# Patient Record
Sex: Female | Born: 1950
Health system: Southern US, Community
[De-identification: ages and names within clinical notes are randomized; demographics above are authoritative.]

## PROBLEM LIST (undated history)

## (undated) DIAGNOSIS — I10 Essential (primary) hypertension: Secondary | ICD-10-CM

## (undated) DIAGNOSIS — E7800 Pure hypercholesterolemia, unspecified: Secondary | ICD-10-CM

## (undated) DIAGNOSIS — Z8489 Family history of other specified conditions: Secondary | ICD-10-CM

## (undated) DIAGNOSIS — M069 Rheumatoid arthritis, unspecified: Secondary | ICD-10-CM

## (undated) HISTORY — PX: TOTAL ABDOMINAL HYSTERECTOMY W/ BILATERAL SALPINGOOPHORECTOMY: SHX83

## (undated) HISTORY — PX: EYE SURGERY: SHX253

## (undated) HISTORY — DX: Rheumatoid arthritis, unspecified: M06.9

## (undated) HISTORY — PX: HIP SURGERY: SHX245

## (undated) HISTORY — DX: Essential (primary) hypertension: I10

## (undated) HISTORY — DX: Pure hypercholesterolemia, unspecified: E78.00

---

## 1998-12-08 ENCOUNTER — Other Ambulatory Visit: Admission: RE | Admit: 1998-12-08 | Discharge: 1998-12-08 | Payer: Self-pay | Admitting: Obstetrics & Gynecology

## 1999-12-23 ENCOUNTER — Other Ambulatory Visit: Admission: RE | Admit: 1999-12-23 | Discharge: 1999-12-23 | Payer: Self-pay | Admitting: Obstetrics & Gynecology

## 2000-04-01 ENCOUNTER — Encounter (INDEPENDENT_AMBULATORY_CARE_PROVIDER_SITE_OTHER): Payer: Self-pay

## 2000-04-01 ENCOUNTER — Observation Stay (HOSPITAL_COMMUNITY): Admission: RE | Admit: 2000-04-01 | Discharge: 2000-04-02 | Payer: Self-pay | Admitting: Obstetrics & Gynecology

## 2001-01-31 ENCOUNTER — Encounter: Payer: Self-pay | Admitting: General Practice

## 2001-01-31 ENCOUNTER — Encounter: Admission: RE | Admit: 2001-01-31 | Discharge: 2001-01-31 | Payer: Self-pay | Admitting: General Practice

## 2001-02-03 ENCOUNTER — Encounter: Payer: Self-pay | Admitting: General Practice

## 2001-02-03 ENCOUNTER — Encounter: Admission: RE | Admit: 2001-02-03 | Discharge: 2001-02-03 | Payer: Self-pay | Admitting: General Practice

## 2001-11-27 DIAGNOSIS — D229 Melanocytic nevi, unspecified: Secondary | ICD-10-CM

## 2001-11-27 HISTORY — DX: Melanocytic nevi, unspecified: D22.9

## 2003-04-25 ENCOUNTER — Encounter: Payer: Self-pay | Admitting: Ophthalmology

## 2003-04-26 ENCOUNTER — Ambulatory Visit (HOSPITAL_COMMUNITY): Admission: RE | Admit: 2003-04-26 | Discharge: 2003-04-26 | Payer: Self-pay | Admitting: Ophthalmology

## 2003-11-22 ENCOUNTER — Ambulatory Visit (HOSPITAL_COMMUNITY): Admission: RE | Admit: 2003-11-22 | Discharge: 2003-11-22 | Payer: Self-pay | Admitting: Gastroenterology

## 2003-11-22 ENCOUNTER — Encounter (INDEPENDENT_AMBULATORY_CARE_PROVIDER_SITE_OTHER): Payer: Self-pay | Admitting: *Deleted

## 2004-09-23 DIAGNOSIS — D229 Melanocytic nevi, unspecified: Secondary | ICD-10-CM

## 2004-09-23 HISTORY — DX: Melanocytic nevi, unspecified: D22.9

## 2007-03-29 ENCOUNTER — Encounter: Payer: Self-pay | Admitting: Internal Medicine

## 2008-04-01 ENCOUNTER — Encounter: Payer: Self-pay | Admitting: Internal Medicine

## 2009-01-03 ENCOUNTER — Encounter: Payer: Self-pay | Admitting: Internal Medicine

## 2009-02-26 ENCOUNTER — Encounter: Payer: Self-pay | Admitting: Internal Medicine

## 2009-03-03 ENCOUNTER — Encounter: Payer: Self-pay | Admitting: Internal Medicine

## 2009-04-24 ENCOUNTER — Encounter: Payer: Self-pay | Admitting: Internal Medicine

## 2009-05-08 ENCOUNTER — Ambulatory Visit: Payer: Self-pay | Admitting: Internal Medicine

## 2009-05-08 DIAGNOSIS — I6529 Occlusion and stenosis of unspecified carotid artery: Secondary | ICD-10-CM | POA: Insufficient documentation

## 2009-05-09 ENCOUNTER — Encounter: Payer: Self-pay | Admitting: Internal Medicine

## 2009-05-22 ENCOUNTER — Ambulatory Visit: Payer: Self-pay

## 2009-05-22 ENCOUNTER — Encounter: Payer: Self-pay | Admitting: Internal Medicine

## 2009-05-29 ENCOUNTER — Telehealth: Payer: Self-pay | Admitting: Internal Medicine

## 2009-06-13 ENCOUNTER — Encounter: Payer: Self-pay | Admitting: Internal Medicine

## 2009-06-16 ENCOUNTER — Telehealth (INDEPENDENT_AMBULATORY_CARE_PROVIDER_SITE_OTHER): Payer: Self-pay | Admitting: *Deleted

## 2009-07-24 ENCOUNTER — Emergency Department (HOSPITAL_COMMUNITY): Admission: EM | Admit: 2009-07-24 | Discharge: 2009-07-24 | Payer: Self-pay | Admitting: Family Medicine

## 2009-08-19 ENCOUNTER — Ambulatory Visit: Payer: Self-pay | Admitting: Internal Medicine

## 2009-11-13 ENCOUNTER — Telehealth: Payer: Self-pay | Admitting: Internal Medicine

## 2009-11-21 ENCOUNTER — Telehealth (INDEPENDENT_AMBULATORY_CARE_PROVIDER_SITE_OTHER): Payer: Self-pay | Admitting: *Deleted

## 2010-05-26 ENCOUNTER — Telehealth: Payer: Self-pay | Admitting: Internal Medicine

## 2010-06-09 ENCOUNTER — Encounter: Payer: Self-pay | Admitting: Internal Medicine

## 2010-06-10 ENCOUNTER — Ambulatory Visit: Payer: Self-pay | Admitting: Internal Medicine

## 2010-06-10 DIAGNOSIS — E7800 Pure hypercholesterolemia, unspecified: Secondary | ICD-10-CM | POA: Insufficient documentation

## 2010-06-16 ENCOUNTER — Telehealth: Payer: Self-pay | Admitting: Internal Medicine

## 2010-06-16 LAB — CONVERTED CEMR LAB
AST: 25 units/L (ref 0–37)
Cholesterol: 129 mg/dL (ref 0–200)
HDL: 57.2 mg/dL (ref >39.00)
LDL Cholesterol: 57 mg/dL (ref 0–99)
Total CHOL/HDL Ratio: 2
Triglycerides: 76 mg/dL (ref 0.0–149.0)
VLDL: 15.2 mg/dL (ref 0.0–40.0)

## 2010-06-22 ENCOUNTER — Telehealth (INDEPENDENT_AMBULATORY_CARE_PROVIDER_SITE_OTHER): Payer: Self-pay | Admitting: *Deleted

## 2010-10-13 NOTE — Progress Notes (Signed)
Summary: question re lab work  Phone Note Call from Patient   Caller: Patient Reason for Call: Talk to Nurse Summary of Call: pt calling to find out id she needs lab work before her doppler 9-28-pls call [phone] Initial call taken by: Glynda Jaeger,  May 26, 2010 10:00 AM  Follow-up for Phone Call        I called and spoke with the pt. I made her aware that we will review with Dr. Tenny Craw and call her back. She is agreeable. Sherri Rad, RN, BSN  May 26, 2010 10:11 AM   Additional Follow-up for Phone Call Additional follow up Details #1::        Yes  Needs fasting lipids and AST. Additional Follow-up by: Sherrill Raring, MD, [facility],  May 26, 2010 11:58 AM     Appended Document: question re lab work Called patient .Marland Kitchen...she will have fasting lab work on 06/10/2010 after doppler study.

## 2010-10-13 NOTE — Progress Notes (Signed)
Summary: med question  Phone Note Call from Patient Call back at Work Phone [phone]   Caller: Patient Reason for Call: Talk to Nurse Summary of Call: has some questions about refill on zocor... bottle states refill good for 1 yr pharmacy states only 6 mths Initial call taken by: Migdalia Dk,  November 13, 2009 10:38 AM  Follow-up for Phone Call        Called patient back ...she will do some price comparison and will call me if she wants script sent to another pharmacy or mailed to her if she wants to mail to [place]. Follow-up by: Suzan Garibaldi RN

## 2010-10-13 NOTE — Progress Notes (Signed)
Summary: rtn called from yesterday  Phone Note Call from Patient Call back at Home Phone [phone]   Caller: Spouse work # [phone] Reason for Call: Talk to Nurse Details for Reason: rtn jackie called from yesterday.  Initial call taken by: Lorne Skeens,  June 16, 2010 8:31 AM  Follow-up for Phone Call        Pt. returned call. Lipid and Carotid u/s results given. Pt. verbalized understanding. Follow-up by: Ollen Gross, RN, BSN,  June 16, 2010 9:01 AM

## 2010-10-13 NOTE — Progress Notes (Signed)
  Message left on phone to Mail out Doppler,Labs.Marland KitchenMarland KitchenMarland KitchenBoth dropped in mail this a.m  [facility]  June 22, 2010 9:36 AM      Appended Document:  Mailed ptT labs out to her today

## 2010-10-13 NOTE — Progress Notes (Signed)
  Spoke w/ pt this am mailed out Labs to her that were drawn in December. Maria Rich  November 21, 2009 11:43 AM

## 2010-10-13 NOTE — Miscellaneous (Signed)
Summary: Orders Update  Clinical Lists Changes  Orders: Added new Test order of Carotid Duplex (Carotid Duplex) - Signed 

## 2010-12-23 ENCOUNTER — Inpatient Hospital Stay (INDEPENDENT_AMBULATORY_CARE_PROVIDER_SITE_OTHER)
Admission: RE | Admit: 2010-12-23 | Discharge: 2010-12-23 | Disposition: A | Discharge status: Home / Self Care | Payer: BLUE CROSS/BLUE SHIELD | Source: Ambulatory Visit | Attending: Emergency Medicine | Admitting: Emergency Medicine

## 2010-12-23 DIAGNOSIS — B029 Zoster without complications: Secondary | ICD-10-CM

## 2011-01-21 ENCOUNTER — Telehealth: Payer: Self-pay | Admitting: Internal Medicine

## 2011-01-25 MED ORDER — SIMVASTATIN 20 MG PO TABS: 20.0000 mg | ORAL_TABLET | Freq: Every day | ORAL | Status: DC

## 2011-01-25 NOTE — Telephone Encounter (Signed)
Pt needs a 90 day supply for Zocor (simvastin) 20 mg sent to [facility] in [place]. Qty 90 with 3 refills

## 2011-01-29 NOTE — Op Note (Signed)
[facility]  Patient:    Maria Rich, Maria Rich                     MRN: [id number] Proc. Date: 04/01/00 Adm. Date:  [id number] Attending:  Lars Pinks                           Operative Report  PREOPERATIVE DIAGNOSIS:  Myoma, uterine.  POSTOPERATIVE DIAGNOSIS:  Myoma, uterine.  PROCEDURE:  Total abdominal hysterectomy and bilateral salpingo-oophorectomy.  SURGEON:  Richard D. Arlyce Dice, M.D.  ASSISTANT:  Luvenia Redden, M.D.  ANESTHESIA:  Epidural.  ESTIMATED BLOOD LOSS:  100 cc.  FINDINGS:  There was an enlarged uterus, approximately 400 gm, with multiple myoma.  The kidneys were palpable bilaterally.  The liver edge was smooth. The pelvis was free of adhesions, endometriosis, or pathology.  INDICATIONS:  This is a 60 year old Gravida 1, Para 1, who has been noted to have enlarging myoma over the last five years, and in the past one year, she has developed heavier periods and because of the enlarging myoma and the abnormal vaginal bleeding, the decision was made to proceed with hysterectomy. Due to the patients age, the decision was made to do a prophylactic oophorectomy as well.  DESCRIPTION OF PROCEDURE:  The patient was taken to the operating room.  An epidural anesthesia was placed.  The abdomen was prepped and draped in a sterile fashion.  The bladder was catheterized.  A low transverse incision was made and carried down to the fascia which was extended transversely.  The rectus muscle was divided from the overlying rectus sheath and then divided in the midline.  The peritoneum was entered sharply and extended vertically.  The self-retaining OConnor-OSullivan retractor was placed.  The bowel was packed.  The uterus was delivered through the incision, and round ligaments were cauterized and incised.  The bladder plane was created over the anterior surface of the uterus.  The infundibular pelvic ligaments were isolated bilaterally,  clamped, and doubly ligated.  The uterine ora was skeletized, clamped, and ligated.  The perimetria was clamped, cut, and ligated.  The vagina was entered sharply, and the cervix, uterus, tubes, and ovaries were then removed.  The angles of the vagina were controlled with figure-of-eight angled sutures.  The vaginal cuff was then closed with figure-of-eight sutures.  The pelvis was inspected, and hemostasis was noted to be present. The lap packs were removed.  The self-retaining retractor was removed.  The peritoneum was closed with running #0 Vicryl suture which included some of the rectus muscle to reappose it in the midline.  The fascia was then closed with a running Vicryl suture, and the skin was closed with a subcuticular 4-0 Dexon suture.  The patient tolerated the procedure well and left the operating room in good condition. DD:  04/01/00 TD:  04/03/00 Job: [id number]

## 2011-01-29 NOTE — Op Note (Signed)
NAME:  Maria Rich, Maria Rich                        ACCOUNT NO.:  [id number]   MEDICAL RECORD NO.:  [id number]                   PATIENT TYPE:  OIB   LOCATION:  NA                                   FACILITY:  [facility]   PHYSICIAN:  Guadelupe Sabin, M.D.             DATE OF BIRTH:  May 05, 1951   DATE OF PROCEDURE:  04/26/2003  DATE OF DISCHARGE:                                 OPERATIVE REPORT   PREOPERATIVE DIAGNOSIS:  Posterior subcapsular cataract, right eye.   POSTOPERATIVE DIAGNOSIS:  Posterior subcapsular cataract, right eye.   NAME OF OPERATION:  Planned extracapsular cataract extraction-  phacoemulsification, primary insertion of posterior chamber intraocular lens  implant.   SURGEON:  Dr. Cecilie Kicks.   ASSISTANT:  Nurse.   ANESTHESIA:  Local 4% Xylocaine, 0.75% Marcaine, retrobulbar block, topical  tetracaine, intraocular Xylocaine. Anesthesia standby required. Patient  given sodium Pentothal intravenously during the period of retrobulbar  blocking.   OPERATIVE PROCEDURE:  After the patient was prepped and draped, a lid  speculum was inserted in the left eye. GS tonometry was recorded at 5 to 6  scale units with a 5.5 g weight. A peritomy was performed adjacent to the  limbus from the 11 to 1 o'clock position. The corneoscleral junction was  cleaned and a corneoscleral groove made with a 45-degree super blade. The  anterior chamber was then entered with a 2.5-mm diamond keratome at the 12  o'clock position and the 15-degree blade at the 2:30 position. Using a bent  26-gauge needle and a Healon syringe, a circular capsulorrhexis was begun  and completed with the Graybow forceps. Hydrodissection and hydrodelineation  were performed 1% Xylocaine. The 30-degree phacoemulsification tip was then  inserted with slow-controlled emulsification but basically primary  irrigation/aspiration of the rather soft posterior subcapsular cataract.  Total ultrasonic time 30 seconds. Average power  level 9%. Total amount of  fluid used 60 cc. Following removal of the nucleus, the residual cortex was  aspirated with the irrigation/aspiration tip. The posterior capsule appeared  intact with a brilliant red fundus reflex. It was therefore elected to  insert an [employer]er[employer] medical optics [id number] silicone three piece posterior  chamber intraocular lens implant, diopter strength +19.50. This was inserted  with the McDonald forceps into the anterior chamber and then centered into  the capsular bag using the [facility] lens rotator. The lens appeared to be well  centered. The Healon which had been used throughout the procedure was  aspirated and replaced with a balanced salt solution and Miochol ophthalmic  solution. The operative incisions appeared to be leaking slightly, and two  10-0 interrupted nylon sutures were used to close the incision in this very  active patient. Pilopine ophthalmic solution ointment was instilled in  conjunctival cul-de-sac and Maxitrol ointment. The light patch and  protective shield were applied to the operated right eye. Duration of  procedure 45 minutes. The patient left the operating room  for the recovery  room in good condition.                                               Guadelupe Sabin, M.D.    HNJ/MEDQ  D:  04/26/2003  T:  04/27/2003  Job:  [id number]

## 2011-01-29 NOTE — Discharge Summary (Signed)
[facility]  Patient:    Maria Rich, Maria Rich                     MRN: [id number] Adm. Date:  [id number] Disc. Date: [id number] Attending:  Lars Pinks                           Discharge Summary  FINAL DIAGNOSIS:              Myoma uteri.  SECONDARY DIAGNOSIS:          None.  PROCEDURE:                    Total abdominal hysterectomy and bilateral salpingo-oophorectomy.  COMPLICATIONS:                None.  CONDITION ON DISCHARGE:       Improved.  HOSPITAL COURSE:               This is a 60 year old gravida 1, para 1 who was admitted for total abdominal hysterectomy and bilateral salpingo-oophorectomy due enlarging uterine myoma.  The patient was taken to the operating room on the day of admission, where a total abdominal hysterectomy and bilateral salpingo-oophorectomy was performed under epidural anesthesia otherwise complication.  The patients postoperative course was benign.  On the morning of the first postoperative day, the patient was ambulating well, urinating, passing gas and eating.  She also requested discharge.  She was afebrile. Vital signs were stable.  Her blood work was normal.  She was, therefore, discharged 24 hours postoperatively.  She was discharged on a regular diet and told to limit her activity.  She was given Tylox, #30, to take 1-2 q.4h. for pain and asked to return to the office in four weeks for follow up evaluation.  LABORATORY DATA:              Hemoglobin on admission was 14.1 with 7800 white count and platelet count 268.  Postoperatively, hemoglobin was 12.5 with white count 7.9.  Routine preoperative chemistry studies were all within normal limits including liver functions.  Urinalysis was negative.  The pathology report showed that the uterus had myoma on it, which were all benign.  The uterus weighed 515 g with multiple benign uterine myoma.  The endometrium was secretory and benign.  There was a rare  focus of adenomyosis. The right tube and both ovaries were normal without pathological diagnosis. DD:  04/20/00 TD:  04/21/00 Job: [id number]

## 2011-01-29 NOTE — H&P (Signed)
   NAME:  Maria Rich, Maria Rich                        ACCOUNT NO.:  [id number]   MEDICAL RECORD NO.:  [id number]                   PATIENT TYPE:  OIB   LOCATION:  NA                                   FACILITY:  [facility]   PHYSICIAN:  Guadelupe Sabin, M.D.             DATE OF BIRTH:  03-22-1951   DATE OF ADMISSION:  DATE OF DISCHARGE:                                HISTORY & PHYSICAL   REASON FOR ADMISSION:  This was a planned outpatient readmission of this 60-  year-old  admitted for cataract implant surgery of the right eye.   HISTORY OF PRESENT ILLNESS:  This patient has noted increased difficulty  seeing with the right eye. The patient closes her left eye and finds that  her right vision is hazy. The patient was first seen in my office on March 13, 2003 complaining of a veil-like haze in the right eye. Examination  revealed a visual acuity of 20/30 -2 right eye, 20/25 +2 left eye. Slit-lamp  examination revealed a posterior subcapsular cataract which was felt to be  the cause of her hazy vision. Detailed fundus examination and slit-lamp and  applanation tonometry were all within normal limits. It was felt that this  was a significant problem, and the patient wished to proceed with cataract  implant surgery. She was given oral discussion and printed information  concerned the procedure and its possible complications. She signed an  informed consent, and arrangements were made for outpatient admission at  this time.   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 She smokes  one pack of cigarettes per day, using a hormone patch, but otherwise is in  excellent general health.   REVIEW OF SYSTEMS:  No cardiorespiratory complaints.   PHYSICAL EXAMINATION:  GENERAL APPEARANCE:  The patient is a pleasant 60-  year-old white female in no acute distress.  HEENT:  Ocular exam as noted above.  CHEST:   Lungs clear to percussion and auscultation.  HEART:  Normal sinus rhythm. No cardiomegaly. No murmurs.  ABDOMEN:  Negative.  EXTREMITIES:  Negative.   ADMISSION DIAGNOSES:  Posterior subcapsular cataract, right eye.   SURGICAL PLAN:  Cataract implant surgery, right eye.                                                Guadelupe Sabin, M.D.    HNJ/MEDQ  D:  04/26/2003  T:  04/27/2003  Job:  [id number]

## 2011-03-30 ENCOUNTER — Other Ambulatory Visit: Payer: Self-pay | Admitting: Physician Assistant

## 2011-05-10 ENCOUNTER — Telehealth: Payer: Self-pay | Admitting: *Deleted

## 2011-05-10 DIAGNOSIS — E782 Mixed hyperlipidemia: Secondary | ICD-10-CM

## 2011-05-10 MED ORDER — SIMVASTATIN 20 MG PO TABS: 20.0000 mg | ORAL_TABLET | Freq: Every day | ORAL | Status: DC

## 2011-05-10 NOTE — Telephone Encounter (Signed)
Patient called requesting to schedule a yearly carotid ultrasound and labs. She would also like to have her lab work completed on the same day. Scheduled both for 10/12. Needs refill on Simvastatin 20mg  prior to visit. Asking for 90 day supply.

## 2011-06-25 ENCOUNTER — Other Ambulatory Visit: Payer: BLUE CROSS/BLUE SHIELD | Admitting: *Deleted

## 2011-06-25 ENCOUNTER — Encounter: Payer: BLUE CROSS/BLUE SHIELD | Admitting: *Deleted

## 2011-06-28 ENCOUNTER — Other Ambulatory Visit: Payer: Self-pay | Admitting: Internal Medicine

## 2011-06-28 DIAGNOSIS — I6529 Occlusion and stenosis of unspecified carotid artery: Secondary | ICD-10-CM

## 2011-06-30 ENCOUNTER — Other Ambulatory Visit: Payer: BLUE CROSS/BLUE SHIELD | Admitting: *Deleted

## 2011-07-02 ENCOUNTER — Encounter (INDEPENDENT_AMBULATORY_CARE_PROVIDER_SITE_OTHER): Payer: BC Managed Care – PPO | Admitting: *Deleted

## 2011-07-02 ENCOUNTER — Other Ambulatory Visit (INDEPENDENT_AMBULATORY_CARE_PROVIDER_SITE_OTHER): Payer: BC Managed Care – PPO | Admitting: *Deleted

## 2011-07-02 DIAGNOSIS — E782 Mixed hyperlipidemia: Secondary | ICD-10-CM

## 2011-07-02 DIAGNOSIS — I6529 Occlusion and stenosis of unspecified carotid artery: Secondary | ICD-10-CM

## 2011-07-02 LAB — LIPID PANEL
Total CHOL/HDL Ratio: 2
Triglycerides: 58 mg/dL (ref 0.0–149.0)

## 2011-07-05 ENCOUNTER — Encounter: Payer: Self-pay | Admitting: Internal Medicine

## 2011-07-09 ENCOUNTER — Ambulatory Visit (INDEPENDENT_AMBULATORY_CARE_PROVIDER_SITE_OTHER): Payer: BC Managed Care – PPO | Admitting: Internal Medicine

## 2011-07-09 ENCOUNTER — Encounter: Payer: Self-pay | Admitting: Internal Medicine

## 2011-07-09 DIAGNOSIS — I10 Essential (primary) hypertension: Secondary | ICD-10-CM

## 2011-07-09 DIAGNOSIS — I6529 Occlusion and stenosis of unspecified carotid artery: Secondary | ICD-10-CM

## 2011-07-09 DIAGNOSIS — Z720 Tobacco use: Secondary | ICD-10-CM

## 2011-07-09 DIAGNOSIS — F17200 Nicotine dependence, unspecified, uncomplicated: Secondary | ICD-10-CM

## 2011-07-09 DIAGNOSIS — E7800 Pure hypercholesterolemia, unspecified: Secondary | ICD-10-CM

## 2011-07-09 DIAGNOSIS — I119 Hypertensive heart disease without heart failure: Secondary | ICD-10-CM

## 2011-07-09 MED ORDER — LISINOPRIL 5 MG PO TABS: 5.0000 mg | ORAL_TABLET | Freq: Every day | ORAL | Status: DC

## 2011-07-09 NOTE — Patient Instructions (Signed)
Return for BMET Lab work in about 10 days  Return to see Dr.Ross in 6 to 8 weeks

## 2011-07-09 NOTE — Progress Notes (Signed)
HPIMs. Maria Rich is a 60 year old with a history of mild CV disease (by screening at an outpatient center). She has no known coronary artery disease. Since seen very active.  [employer] DO.  Swims.  NoCP  No SOB.  Still smoking  1/2 ppd. BP recently going up.  BP 160 at home.     No Known Allergies  Current Outpatient Prescriptions  Medication Sig Dispense Refill  . aspirin 81 MG tablet Take 81 mg by mouth daily.        . calcium carbonate 200 MG capsule 600 mg DAILY      . estradiol (CLIMARA - DOSED IN MG/24 HR) 0.05 mg/24hr Place 1 patch onto the skin once a week.        . Fish Oil-Cholecalciferol (FISH OIL + D3) 1200-1000 MG-UNIT CAPS Take by mouth. Take 1 tab twice a day       . Multiple Vitamin (MULTIVITAMIN) tablet Take 1 tablet by mouth daily.        . Niacin 500 MG TBCR Take by mouth.        . Nutritional Supplements (MELATONIN PO) Take by mouth. Daily during the week to help pt sleep       . simvastatin (ZOCOR) 20 MG tablet Take 1 tablet (20 mg total) by mouth at bedtime.  90 tablet  0    No past medical history on file.  Past Surgical History  Procedure Date  . Eye surgery     cataract implant surgery,right eye  . Total abdominal hysterectomy w/ bilateral salpingoophorectomy     No family history on file.  History   Social History  . Marital Status: Married    Spouse Name: N/A    Number of Children: N/A  . Years of Education: N/A   Occupational History  . Not on file.   Social History Main Topics  . Smoking status: Current Everyday Smoker  . Smokeless tobacco: Not on file  . Alcohol Use: Not on file  . Drug Use: Not on file  . Sexually Active: Not on file   Other Topics Concern  . 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 She smokes one pack of cigarettes per day, down to 4 cigs per day, using a hormone patch    Review of Systems:  All systems reviewed.  They are negative to the above problem  except as previously stated.  Vital Signs: BP 160/70  Pulse 50  Ht 5\' 2"  (1.575 m)  Wt 121 lb (54.885 kg)  BMI 22.13 kg/m2  Physical Exam  Patient is in NAD HEENT:  Normocephalic, atraumatic. EOMI, PERRLA.  Neck: JVP is normal. No thyromegaly. No bruits.  Lungs: clear to auscultation. No rales no wheezes.  Heart: Regular rate and rhythm. Normal S1, S2. No S3.   No significant murmurs. PMI not displaced.  Abdomen:  Supple, nontender. Normal bowel sounds. No masses. No hepatomegaly.  Extremities:   Good distal pulses throughout. No lower extremity edema.  Musculoskeletal :moving all extremities.  Neuro:   alert and oriented x3.  CN II-XII grossly intact.   Assessment and Plan:

## 2011-07-11 DIAGNOSIS — Z720 Tobacco use: Secondary | ICD-10-CM | POA: Insufficient documentation

## 2011-07-11 DIAGNOSIS — I10 Essential (primary) hypertension: Secondary | ICD-10-CM | POA: Insufficient documentation

## 2011-07-11 NOTE — Assessment & Plan Note (Signed)
WIll f/u in future.

## 2011-07-11 NOTE — Assessment & Plan Note (Signed)
Counselled on quitting. 

## 2011-07-11 NOTE — Assessment & Plan Note (Signed)
Excellent control  Could back down to 10 of simvistatin.  F/U in future.

## 2011-07-11 NOTE — Assessment & Plan Note (Signed)
BP is up.  I would add lisinoprl 5.  F/U BMET in 10 days.  F/U in clinic in 6 to 8 wks.

## 2011-07-20 ENCOUNTER — Other Ambulatory Visit (INDEPENDENT_AMBULATORY_CARE_PROVIDER_SITE_OTHER): Payer: BC Managed Care – PPO | Admitting: *Deleted

## 2011-07-20 DIAGNOSIS — I119 Hypertensive heart disease without heart failure: Secondary | ICD-10-CM

## 2011-07-20 LAB — BASIC METABOLIC PANEL
CO2: 26 mEq/L (ref 19–32)
Chloride: 109 mEq/L (ref 96–112)
Sodium: 141 mEq/L (ref 135–145)

## 2011-08-09 ENCOUNTER — Telehealth: Payer: Self-pay | Admitting: Internal Medicine

## 2011-08-09 MED ORDER — LISINOPRIL 5 MG PO TABS: ORAL_TABLET | ORAL | Status: DC

## 2011-08-09 NOTE — Telephone Encounter (Signed)
Increase to 10mg   Check BMET in 10 days.

## 2011-08-09 NOTE — Telephone Encounter (Addendum)
Called patient back. She is concerned about her BP readings. Started Lisinopril 5mg  every day after the last office visit. She states that she has 11 readings of BP 140 to 149 over 60 to 70 7 readings of BP 150 to 159 over 60 to 70 and 5 readings where BP is greater than 160/70. These readings were taken with a wrist cuff. Advised will discuss with Dr.Ross and call her back.

## 2011-08-09 NOTE — Telephone Encounter (Signed)
Called patient back. She will increase Lisinopril to 10mg  every day and have a bmet on 12/6 at the [facility]. Office.

## 2011-08-09 NOTE — Telephone Encounter (Signed)
New Msg: Pt call with questions on pt BP readings. Please return pt call to discuss further.

## 2011-08-19 ENCOUNTER — Other Ambulatory Visit (INDEPENDENT_AMBULATORY_CARE_PROVIDER_SITE_OTHER): Payer: BC Managed Care – PPO

## 2011-08-19 DIAGNOSIS — I119 Hypertensive heart disease without heart failure: Secondary | ICD-10-CM

## 2011-08-19 LAB — BASIC METABOLIC PANEL
BUN: 15 mg/dL (ref 6–23)
Chloride: 108 mEq/L (ref 96–112)
Glucose, Bld: 97 mg/dL (ref 70–99)
Potassium: 4 mEq/L (ref 3.5–5.1)

## 2011-08-25 DIAGNOSIS — H2640 Unspecified secondary cataract: Secondary | ICD-10-CM | POA: Insufficient documentation

## 2011-09-09 ENCOUNTER — Encounter: Payer: Self-pay | Admitting: Internal Medicine

## 2011-09-09 ENCOUNTER — Ambulatory Visit (INDEPENDENT_AMBULATORY_CARE_PROVIDER_SITE_OTHER): Payer: BC Managed Care – PPO | Admitting: Internal Medicine

## 2011-09-09 DIAGNOSIS — F17200 Nicotine dependence, unspecified, uncomplicated: Secondary | ICD-10-CM

## 2011-09-09 DIAGNOSIS — I6529 Occlusion and stenosis of unspecified carotid artery: Secondary | ICD-10-CM

## 2011-09-09 DIAGNOSIS — Z720 Tobacco use: Secondary | ICD-10-CM

## 2011-09-09 DIAGNOSIS — I10 Essential (primary) hypertension: Secondary | ICD-10-CM

## 2011-09-09 DIAGNOSIS — I119 Hypertensive heart disease without heart failure: Secondary | ICD-10-CM

## 2011-09-09 DIAGNOSIS — E7800 Pure hypercholesterolemia, unspecified: Secondary | ICD-10-CM

## 2011-09-09 MED ORDER — LISINOPRIL-HYDROCHLOROTHIAZIDE 10-12.5 MG PO TABS: 1.0000 | ORAL_TABLET | Freq: Every day | ORAL | Status: DC

## 2011-09-09 MED ORDER — LISINOPRIL 5 MG PO TABS: ORAL_TABLET | ORAL | Status: DC

## 2011-09-09 NOTE — Assessment & Plan Note (Signed)
Mild disease.

## 2011-09-09 NOTE — Patient Instructions (Signed)
Lab work in 2 weeks at [facility] [address]

## 2011-09-09 NOTE — Assessment & Plan Note (Addendum)
I would switch to Prinizide 10/12.5.  She will call in a few weeks.  Still not controlled.  Repeat labs in 2 wks.

## 2011-09-09 NOTE — Progress Notes (Addendum)
HPI Patient is a 60 year old with a history of mild CV disease, dyslipidemia and HTN.  I saw her in October.  BP was up at that time . I added 5 lisinopril  Patient has increased to 10 at home as bp was still up.   At home BP is labile 120s to 170s  Stress level has gone down just recently No Known Allergies  Current Outpatient Prescriptions  Medication Sig Dispense Refill  . aspirin 81 MG tablet Take 81 mg by mouth daily.        . calcium carbonate 200 MG capsule 600 mg DAILY      . estradiol (CLIMARA - DOSED IN MG/24 HR) 0.05 mg/24hr Place 1 patch onto the skin once a week.        . Fish Oil-Cholecalciferol (FISH OIL + D3) 1200-1000 MG-UNIT CAPS Take by mouth. Take 1 tab twice a day       . lisinopril (PRINIVIL,ZESTRIL) 5 MG tablet 2 tabs every day  30 tablet  11  . Multiple Vitamin (MULTIVITAMIN) tablet Take 1 tablet by mouth daily.        . Niacin 500 MG TBCR Take by mouth.        . Nutritional Supplements (MELATONIN PO) Take by mouth. Daily during the week to help pt sleep       . simvastatin (ZOCOR) 20 MG tablet Take 10 mg by mouth at bedtime.          No past medical history on file.  Past Surgical History  Procedure Date  . Eye surgery     cataract implant surgery,right eye  . Total abdominal hysterectomy w/ bilateral salpingoophorectomy     No family history on file.  History   Social History  . Marital Status: Married    Spouse Name: N/A    Number of Children: N/A  . Years of Education: N/A   Occupational History  . Not on file.   Social History Main Topics  . Smoking status: Current Everyday Smoker  . Smokeless tobacco: Not on file  . Alcohol Use: Not on file  . Drug Use: Not on file  . Sexually Active: Not on file   Other Topics Concern  . 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 She smokes one pack of cigarettes per day, down to 4 cigs per day, using a hormone patch    Review of  Systems:  All systems reviewed.  They are negative to the above problem except as previously stated.  Vital Signs: BP 152/72  Pulse 52  Ht 5' 1.5" (1.562 m)  Wt 121 lb 6.4 oz (55.067 kg)  BMI 22.57 kg/m2  Physical Exam  Patient is in NAD  HEENT:  Normocephalic, atraumatic. EOMI, PERRLA.  Neck: JVP is normal. No thyromegaly. No bruits.  Lungs: clear to auscultation. No rales no wheezes.  Heart: Regular rate and rhythm. Normal S1, S2. No S3.   No significant murmurs. PMI not displaced.  Abdomen:  Supple, nontender. Normal bowel sounds. No masses. No hepatomegaly.  Extremities:   Good distal pulses throughout. No lower extremity edema.  Musculoskeletal :moving all extremities.  Neuro:   alert and oriented x3.  CN II-XII grossly intact. EKG;  Sinus bradycardia.  52 bpm.  Septal MI.   Assessment and Plan:

## 2011-09-09 NOTE — Assessment & Plan Note (Signed)
Keep on same regimen. 

## 2011-09-09 NOTE — Assessment & Plan Note (Signed)
Counelled  Has smokeless tobacco to use.  She did not have luck with Chantix in past.

## 2011-09-23 ENCOUNTER — Other Ambulatory Visit (INDEPENDENT_AMBULATORY_CARE_PROVIDER_SITE_OTHER): Payer: BC Managed Care – PPO

## 2011-09-23 DIAGNOSIS — I119 Hypertensive heart disease without heart failure: Secondary | ICD-10-CM

## 2011-09-23 DIAGNOSIS — I10 Essential (primary) hypertension: Secondary | ICD-10-CM

## 2011-09-23 LAB — BASIC METABOLIC PANEL
CO2: 27 mEq/L (ref 19–32)
Calcium: 9.6 mg/dL (ref 8.4–10.5)
Glucose, Bld: 96 mg/dL (ref 70–99)
Sodium: 139 mEq/L (ref 135–145)

## 2011-10-07 ENCOUNTER — Telehealth: Payer: Self-pay | Admitting: Internal Medicine

## 2011-10-07 NOTE — Telephone Encounter (Signed)
LMOM for call back. 

## 2011-10-07 NOTE — Telephone Encounter (Signed)
Pt to call with update on BP med

## 2011-10-22 ENCOUNTER — Telehealth: Payer: Self-pay | Admitting: Internal Medicine

## 2011-10-22 NOTE — Telephone Encounter (Signed)
Called patient at work. She wanted to report that her BP has improved. She checks it with an arm cuff now and is running about 120/60. Wants to donate platelets. Dr.Ross advised that this would be OK. Patient aware.

## 2011-10-22 NOTE — Telephone Encounter (Signed)
See note from 10/22/2011.

## 2011-10-22 NOTE — Telephone Encounter (Signed)
Pt calling to give condition update to [employer]

## 2012-06-01 ENCOUNTER — Telehealth: Payer: Self-pay | Admitting: Internal Medicine

## 2012-06-01 DIAGNOSIS — I10 Essential (primary) hypertension: Secondary | ICD-10-CM

## 2012-06-01 DIAGNOSIS — I6529 Occlusion and stenosis of unspecified carotid artery: Secondary | ICD-10-CM

## 2012-06-01 DIAGNOSIS — E7800 Pure hypercholesterolemia, unspecified: Secondary | ICD-10-CM

## 2012-06-01 NOTE — Telephone Encounter (Signed)
Pt wants to set her annual echo or carotid up and appt I do not see order

## 2012-06-01 NOTE — Telephone Encounter (Signed)
Patient called today to scheduled a yearly Carotid Duplex and lab work. Order for both tests placed in [facility] to be done the week of october 22 nd 2013. Patient would like to have the yearly appointment with Dr. Tenny Craw 2 to 3 weeks after tests are done. Patient aware that the scheduler will call her to make appointments.

## 2012-06-01 NOTE — Telephone Encounter (Signed)
Patient is aware of schedule Carotid duplex and lab work on 07/04/12 and FU visit with Dr. Tenny Craw 07/24/12 at 2:15 PM.

## 2012-06-29 ENCOUNTER — Other Ambulatory Visit: Payer: Self-pay | Admitting: *Deleted

## 2012-06-29 DIAGNOSIS — I6529 Occlusion and stenosis of unspecified carotid artery: Secondary | ICD-10-CM

## 2012-07-04 ENCOUNTER — Encounter (INDEPENDENT_AMBULATORY_CARE_PROVIDER_SITE_OTHER): Payer: BC Managed Care – PPO

## 2012-07-04 ENCOUNTER — Other Ambulatory Visit (INDEPENDENT_AMBULATORY_CARE_PROVIDER_SITE_OTHER): Payer: BC Managed Care – PPO

## 2012-07-04 DIAGNOSIS — E7800 Pure hypercholesterolemia, unspecified: Secondary | ICD-10-CM

## 2012-07-04 DIAGNOSIS — I6529 Occlusion and stenosis of unspecified carotid artery: Secondary | ICD-10-CM

## 2012-07-04 DIAGNOSIS — I10 Essential (primary) hypertension: Secondary | ICD-10-CM

## 2012-07-04 LAB — BASIC METABOLIC PANEL
Calcium: 9.9 mg/dL (ref 8.4–10.5)
Creatinine, Ser: 0.7 mg/dL (ref 0.4–1.2)
GFR: 86.66 mL/min (ref >60.00)
Glucose, Bld: 90 mg/dL (ref 70–99)
Sodium: 140 mEq/L (ref 135–145)

## 2012-07-04 LAB — LIPID PANEL
Total CHOL/HDL Ratio: 3
Triglycerides: 86 mg/dL (ref 0.0–149.0)

## 2012-07-13 ENCOUNTER — Telehealth: Payer: Self-pay | Admitting: Internal Medicine

## 2012-07-13 NOTE — Telephone Encounter (Signed)
Called patient with lab results.  

## 2012-07-13 NOTE — Telephone Encounter (Signed)
New Problem:    Patient returned your call form Monday.  Please call back.

## 2012-07-17 ENCOUNTER — Other Ambulatory Visit: Payer: Self-pay | Admitting: Internal Medicine

## 2012-07-24 ENCOUNTER — Encounter: Payer: Self-pay | Admitting: Internal Medicine

## 2012-07-24 ENCOUNTER — Ambulatory Visit (INDEPENDENT_AMBULATORY_CARE_PROVIDER_SITE_OTHER): Payer: BC Managed Care – PPO | Admitting: Internal Medicine

## 2012-07-24 VITALS — BP 118/70 | HR 60 | Ht 62.0 in | Wt 135.0 lb

## 2012-07-24 DIAGNOSIS — E782 Mixed hyperlipidemia: Secondary | ICD-10-CM

## 2012-07-24 DIAGNOSIS — I10 Essential (primary) hypertension: Secondary | ICD-10-CM

## 2012-07-24 NOTE — Progress Notes (Signed)
HPI Patient is a 76 yeark old with a history of mild CV disease, HL, HTN.  He was last in clinic in December 2012.  I switched her to Prinizide 10/12.4.   Counselled on tobacco Carotid USN showed mild stable plaque   Recent labs LDL was 91, HDL was 55.  Last year they were better No Known Allergies  Current Outpatient Prescriptions  Medication Sig Dispense Refill  . aspirin 81 MG tablet Take 81 mg by mouth daily.        . calcium carbonate 200 MG capsule 600 mg DAILY      . estradiol (CLIMARA - DOSED IN MG/24 HR) 0.05 mg/24hr Place 1 patch onto the skin once a week.        . Fish Oil-Cholecalciferol (FISH OIL + D3) 1200-1000 MG-UNIT CAPS Take by mouth. Take 1 tab twice a day       . lisinopril-hydrochlorothiazide (PRINZIDE,ZESTORETIC) 10-12.5 MG per tablet TAKE ONE TABLET BY MOUTH EVERY DAY  90 tablet  2  . Multiple Vitamin (MULTIVITAMIN) tablet Take 1 tablet by mouth daily.        . Niacin 500 MG TBCR Take by mouth.        . Nutritional Supplements (MELATONIN PO) Take by mouth. Daily during the week to help pt sleep       . simvastatin (ZOCOR) 20 MG tablet Take 10 mg by mouth at bedtime.          No past medical history on file.  Past Surgical History  Procedure Date  . Eye surgery     cataract implant surgery,right eye  . Total abdominal hysterectomy w/ bilateral salpingoophorectomy     No family history on file.  History   Social History  . Marital Status: Married    Spouse Name: N/A    Number of Children: N/A  . Years of Education: N/A   Occupational History  . Not on file.   Social History Main Topics  . Smoking status: Current Every Day Smoker  . Smokeless tobacco: Not on file  . Alcohol Use: Not on file  . Drug Use: Not on file  . Sexually Active: Not on file   Other Topics Concern  . 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 She smokes one pack of cigarettes per day, down to 4 cigs per day,  using a hormone patch    Review of Systems:  All systems reviewed.  They are negative to the above problem except as previously stated.  Vital Signs: BP 118/70  Pulse 60  Ht 5\' 2"  (1.575 m)  Wt 135 lb (61.236 kg)  BMI 24.69 kg/m2  Physical Exam Patient is in NAD HEENT:  Normocephalic, atraumatic. EOMI, PERRLA.  Neck: JVP is normal.  No bruits.  Lungs: clear to auscultation. No rales no wheezes.  Heart: Regular rate and rhythm. Normal S1, S2. No S3.   No significant murmurs. PMI not displaced.  Abdomen:  Supple, nontender. Normal bowel sounds. No masses. No hepatomegaly.  Extremities:   Good distal pulses throughout. No lower extremity edema.  Musculoskeletal :moving all extremities.  Neuro:   alert and oriented x3.  CN II-XII grossly intact.  EKG:  SR  60  Q waves V1, V2  Assessment and Plan:  1.  HTN  Good control  2.  CV disease  Mild  Follow  Continue risk factor modification  3.  HL  Patient admits to taking statin in AM  May not be getting full effect.  Lipid panel is not as good as it has been.  WIll switch to evening.  Check lipds in 3 months.  4. Tobacco  COunselled.  Stay active  F/U in 1 year.

## 2012-07-24 NOTE — Patient Instructions (Addendum)
Fasting lab work end of Feb/beginning of March 2014 at the [facility]. Office.  Your physician wants you to follow-up in: 12 months You will receive a reminder letter in the mail two months in advance. If you don't receive a letter, please call our office to schedule the follow-up appointment.

## 2012-07-26 ENCOUNTER — Telehealth: Payer: Self-pay | Admitting: Internal Medicine

## 2012-07-26 NOTE — Telephone Encounter (Signed)
New problem:    From last office visit on  11/11 it's listed that weight is 135. Should be 125. This need to be corrected in the system.

## 2012-07-26 NOTE — Telephone Encounter (Signed)
Unable to change weight in the last office visit since the note is closed.  This message will become part of her chart.  N/A at pt number to let her know this.

## 2012-08-25 DIAGNOSIS — Z961 Presence of intraocular lens: Secondary | ICD-10-CM | POA: Insufficient documentation

## 2012-10-23 ENCOUNTER — Other Ambulatory Visit: Payer: Self-pay | Admitting: Internal Medicine

## 2012-10-28 ENCOUNTER — Other Ambulatory Visit: Payer: Self-pay

## 2012-11-09 ENCOUNTER — Other Ambulatory Visit: Payer: BC Managed Care – PPO

## 2012-11-16 ENCOUNTER — Other Ambulatory Visit (INDEPENDENT_AMBULATORY_CARE_PROVIDER_SITE_OTHER): Payer: BC Managed Care – PPO

## 2012-11-16 DIAGNOSIS — E782 Mixed hyperlipidemia: Secondary | ICD-10-CM

## 2012-11-16 LAB — LIPID PANEL
Cholesterol: 133 mg/dL (ref 0–200)
HDL: 48.3 mg/dL (ref >39.00)
Triglycerides: 91 mg/dL (ref 0.0–149.0)
VLDL: 18.2 mg/dL (ref 0.0–40.0)

## 2013-05-15 ENCOUNTER — Other Ambulatory Visit: Payer: Self-pay | Admitting: Internal Medicine

## 2013-06-15 ENCOUNTER — Telehealth: Payer: Self-pay | Admitting: Internal Medicine

## 2013-06-15 DIAGNOSIS — I2510 Atherosclerotic heart disease of native coronary artery without angina pectoris: Secondary | ICD-10-CM

## 2013-06-15 DIAGNOSIS — E7800 Pure hypercholesterolemia, unspecified: Secondary | ICD-10-CM

## 2013-06-15 NOTE — Telephone Encounter (Signed)
Spoke with pt, carotids and labs scheduled.

## 2013-06-15 NOTE — Telephone Encounter (Signed)
New Problem  Placed an appt per recalls/// pt states she is Due for an ECHO and a lab but there aren't any orders.  Please advise.

## 2013-06-26 ENCOUNTER — Encounter: Payer: Self-pay | Admitting: Podiatry

## 2013-06-26 ENCOUNTER — Ambulatory Visit (INDEPENDENT_AMBULATORY_CARE_PROVIDER_SITE_OTHER): Payer: BC Managed Care – PPO | Admitting: Podiatry

## 2013-06-26 VITALS — BP 141/82 | HR 57 | Resp 18

## 2013-06-26 DIAGNOSIS — L03012 Cellulitis of left finger: Secondary | ICD-10-CM

## 2013-06-26 DIAGNOSIS — IMO0002 Reserved for concepts with insufficient information to code with codable children: Secondary | ICD-10-CM

## 2013-06-26 NOTE — Progress Notes (Signed)
°  Subjective:    Patient ID: Maria Rich, female    DOB: November 07, 1950, 62 y.o.   MRN: [id number]  HPI  Has improved and no soreness and no draining    Review of Systems     Objective:   Physical Exam        Assessment & Plan:

## 2013-06-27 NOTE — Progress Notes (Signed)
Chizuko presents today for followup of a bump to the medial border of the hallux left her matrixectomy was performed several months ago. She states there is no soreness no tenderness just a black spot work on it the last time.  Objective: There is no erythema edema cellulitis drainage or odor. Simply a small area of reactive hyperkeratosis to the tibial border of the hallux nail plate left. I debrided her reactive hyperkeratosis today to normal tissue. I see no signs of infection or nail regrowth.  Assessment: Well-healing hallux left  Plan: Debridement of the tissue today and followup with me on an as-needed basis.

## 2013-07-04 ENCOUNTER — Ambulatory Visit (HOSPITAL_COMMUNITY): Payer: BC Managed Care – PPO | Attending: Cardiology

## 2013-07-04 ENCOUNTER — Other Ambulatory Visit (INDEPENDENT_AMBULATORY_CARE_PROVIDER_SITE_OTHER): Payer: BC Managed Care – PPO

## 2013-07-04 DIAGNOSIS — I2510 Atherosclerotic heart disease of native coronary artery without angina pectoris: Secondary | ICD-10-CM

## 2013-07-04 DIAGNOSIS — I10 Essential (primary) hypertension: Secondary | ICD-10-CM | POA: Insufficient documentation

## 2013-07-04 DIAGNOSIS — E7800 Pure hypercholesterolemia, unspecified: Secondary | ICD-10-CM

## 2013-07-04 DIAGNOSIS — I6529 Occlusion and stenosis of unspecified carotid artery: Secondary | ICD-10-CM

## 2013-07-04 DIAGNOSIS — E785 Hyperlipidemia, unspecified: Secondary | ICD-10-CM | POA: Insufficient documentation

## 2013-07-04 DIAGNOSIS — I658 Occlusion and stenosis of other precerebral arteries: Secondary | ICD-10-CM | POA: Insufficient documentation

## 2013-07-04 DIAGNOSIS — F17200 Nicotine dependence, unspecified, uncomplicated: Secondary | ICD-10-CM | POA: Insufficient documentation

## 2013-07-04 LAB — HEPATIC FUNCTION PANEL
ALT: 20 U/L (ref 0–35)
Alkaline Phosphatase: 50 U/L (ref 39–117)
Bilirubin, Direct: 0.1 mg/dL (ref 0.0–0.3)
Total Protein: 6.5 g/dL (ref 6.0–8.3)

## 2013-07-04 LAB — LIPID PANEL
Cholesterol: 127 mg/dL (ref 0–200)
Triglycerides: 46 mg/dL (ref 0.0–149.0)
VLDL: 9.2 mg/dL (ref 0.0–40.0)

## 2013-07-05 ENCOUNTER — Encounter: Payer: Self-pay | Admitting: *Deleted

## 2013-07-10 ENCOUNTER — Telehealth: Payer: Self-pay | Admitting: Internal Medicine

## 2013-07-10 NOTE — Telephone Encounter (Signed)
Spoke with pt, she was recently at another doctor's office and her bp was elevated. She started rechecking her bp at night and has been consistently getting high 140's to 150's. She has a follow up yearly appt 08-02-13. She wants to know if she should make any changes prior to that appt or wait. The only other change is she has been having hot flashes.m not sure if related to elevated bp or not. Pt aware dr Tenny Craw is not in the office today. Will forward for her review. Pt agreed with this plan.

## 2013-07-10 NOTE — Telephone Encounter (Signed)
New problem:  Pt states she has some questions for the nurse. Please advise

## 2013-07-11 NOTE — Telephone Encounter (Signed)
Keep track of BP  Keep log.  Bring BP cuff into clinic Starting November 10 if BP is still high she can double up lisinopril  Wii check labs at visit.

## 2013-07-11 NOTE — Telephone Encounter (Signed)
Spoke with pt, aware of dr ross recommendations. 

## 2013-07-17 ENCOUNTER — Other Ambulatory Visit: Payer: Self-pay | Admitting: Physician Assistant

## 2013-07-19 ENCOUNTER — Other Ambulatory Visit: Payer: Self-pay

## 2013-08-02 ENCOUNTER — Ambulatory Visit (INDEPENDENT_AMBULATORY_CARE_PROVIDER_SITE_OTHER): Payer: BC Managed Care – PPO | Admitting: Internal Medicine

## 2013-08-02 VITALS — BP 126/64 | HR 51 | Ht 62.0 in | Wt 127.0 lb

## 2013-08-02 DIAGNOSIS — Z79899 Other long term (current) drug therapy: Secondary | ICD-10-CM

## 2013-08-02 DIAGNOSIS — I10 Essential (primary) hypertension: Secondary | ICD-10-CM

## 2013-08-02 LAB — BASIC METABOLIC PANEL
CO2: 28 mEq/L (ref 19–32)
Chloride: 103 mEq/L (ref 96–112)
Creatinine, Ser: 0.8 mg/dL (ref 0.4–1.2)
Potassium: 4.5 mEq/L (ref 3.5–5.1)
Sodium: 137 mEq/L (ref 135–145)

## 2013-08-02 NOTE — Progress Notes (Signed)
HPI Patient is a 11 yeark old with a history of mild CV disease, HL, HTN.  He was last in clinic in the fall 2013.   SInce seen she has done well  She quit smoking in September.   She is active  No CP  No SOB  Last lipid panel a couple wks ago LDL was 50, HDL was 67  No Known Allergies  Current Outpatient Prescriptions  Medication Sig Dispense Refill  . aspirin 81 MG tablet Take 81 mg by mouth daily.        . calcium carbonate 200 MG capsule 600 mg DAILY      . estradiol (CLIMARA - DOSED IN MG/24 HR) 0.05 mg/24hr Place 1 patch onto the skin once a week.        . Fish Oil-Cholecalciferol (FISH OIL + D3) 1200-1000 MG-UNIT CAPS Take by mouth. Take 1 tab twice a day       . lisinopril-hydrochlorothiazide (PRINZIDE,ZESTORETIC) 10-12.5 MG per tablet TAKE ONE TABLET BY MOUTH EVERY DAY  90 tablet  1  . Niacin 500 MG TBCR Take by mouth.        . Nutritional Supplements (MELATONIN PO) Take by mouth. Daily during the week to help pt sleep       . simvastatin (ZOCOR) 20 MG tablet TAKE ONE TABLET BY MOUTH EVERY DAY AT BEDTIME  90 tablet  2   No current facility-administered medications for this visit.    No past medical history on file.  Past Surgical History  Procedure Laterality Date  . Eye surgery      cataract implant surgery,right eye  . Total abdominal hysterectomy w/ bilateral salpingoophorectomy      No family history on file.  History   Social History  . Marital Status: Married    Spouse Name: N/A    Number of Children: N/A  . Years of Education: N/A   Occupational History  . Not on file.   Social History Main Topics  . Smoking status: Former Games developer  . Smokeless tobacco: Never Used     Comment: trying to quit and has been 3 weeks  . Alcohol Use: Not on file  . Drug Use: Not on file  . Sexual Activity: Not on file   Other Topics Concern  . 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 She smokes one  pack of cigarettes per day, down to 4 cigs per day, using a hormone patch    Review of Systems:  All systems reviewed.  They are negative to the above problem except as previously stated.  Vital Signs: BP 126/64  Pulse 51  Ht 5\' 2"  (1.575 m)  Wt 127 lb (57.607 kg)  BMI 23.22 kg/m2  Physical Exam Patient is in NAD HEENT:  Normocephalic, atraumatic. EOMI, PERRLA.  Neck: JVP is normal.  No bruits.  Lungs: clear to auscultation. No rales no wheezes.  Heart: Regular rate and rhythm. Normal S1, S2. No S3.   No significant murmurs. PMI not displaced.  Abdomen:  Supple, nontender. Normal bowel sounds. No masses. No hepatomegaly.  Extremities:   Good distal pulses throughout. No lower extremity edema.  Musculoskeletal :moving all extremities.  Neuro:   alert and oriented x3.  CN II-XII grossly intact.  EKG:  SB 41.    Assessment and Plan:  1.  HTN  Good control  2.  CV disease  Mild  Follow  Continue risk factor modification  3.  HL  Lipids are good.  NO change  4. Tobacco  COngratulated on quitting.      F/U in 1 year.

## 2013-08-02 NOTE — Patient Instructions (Signed)
Your physician wants you to follow-up in:  12 months.  You will receive a reminder letter in the mail two months in advance. If you don't receive a letter, please call our office to schedule the follow-up appointment.   

## 2013-08-03 LAB — VITAMIN D 25 HYDROXY (VIT D DEFICIENCY, FRACTURES): Vit D, 25-Hydroxy: 34 ng/mL (ref 30–89)

## 2013-10-24 ENCOUNTER — Other Ambulatory Visit: Payer: Self-pay | Admitting: Internal Medicine

## 2014-06-18 ENCOUNTER — Encounter: Payer: Self-pay | Admitting: Internal Medicine

## 2014-06-21 ENCOUNTER — Telehealth: Payer: Self-pay | Admitting: Internal Medicine

## 2014-06-21 DIAGNOSIS — I10 Essential (primary) hypertension: Secondary | ICD-10-CM

## 2014-06-21 DIAGNOSIS — E7800 Pure hypercholesterolemia, unspecified: Secondary | ICD-10-CM

## 2014-06-21 DIAGNOSIS — Z79899 Other long term (current) drug therapy: Secondary | ICD-10-CM

## 2014-06-21 NOTE — Telephone Encounter (Signed)
New Message  Pt called staets that for 2 Years Dr. Harrington Challenger mentions a bone densitty scan is needed and that it was never scheduled. Also will make a follow up for ov.. Please put in orders for lab. Pt would like to to go [facility] for lab test. Please call

## 2014-06-21 NOTE — Telephone Encounter (Signed)
lmtcb

## 2014-06-24 NOTE — Telephone Encounter (Signed)
Follow up     Returning Maria Rich's call from last week

## 2014-06-25 NOTE — Telephone Encounter (Signed)
Patient would like to get labs prior to her OV 11/20. Also asked if Dr. Harrington Challenger can order bone density test or should her PCP.

## 2014-06-30 NOTE — Telephone Encounter (Signed)
WOuld get lipids, AST, BMET, CBC Primeary MD should get bone denisity.

## 2014-07-02 NOTE — Telephone Encounter (Signed)
Patient scheduled for labs at [facility] office. Informed her that her PCP needed to order a bone density study/

## 2014-07-02 NOTE — Telephone Encounter (Signed)
F/u   Pt stated she was waiting on call from nurse about some scheduling needs. Please call.

## 2014-07-24 ENCOUNTER — Other Ambulatory Visit: Payer: Self-pay | Admitting: Internal Medicine

## 2014-07-30 ENCOUNTER — Other Ambulatory Visit: Payer: Self-pay

## 2014-07-30 ENCOUNTER — Other Ambulatory Visit (INDEPENDENT_AMBULATORY_CARE_PROVIDER_SITE_OTHER): Payer: BC Managed Care – PPO

## 2014-07-30 DIAGNOSIS — I10 Essential (primary) hypertension: Secondary | ICD-10-CM

## 2014-07-30 DIAGNOSIS — Z79899 Other long term (current) drug therapy: Secondary | ICD-10-CM

## 2014-07-30 DIAGNOSIS — E7800 Pure hypercholesterolemia, unspecified: Secondary | ICD-10-CM

## 2014-07-30 LAB — BASIC METABOLIC PANEL WITH GFR
BUN: 13 mg/dL (ref 6–23)
CO2: 27 meq/L (ref 19–32)
Calcium: 10.2 mg/dL (ref 8.4–10.5)
Chloride: 110 meq/L (ref 96–112)
Creatinine, Ser: 0.9 mg/dL (ref 0.4–1.2)
GFR: 64.28 mL/min
Glucose, Bld: 102 mg/dL — ABNORMAL HIGH (ref 70–99)
Potassium: 4.9 meq/L (ref 3.5–5.1)
Sodium: 140 meq/L (ref 135–145)

## 2014-07-30 LAB — LIPID PANEL
CHOL/HDL RATIO: 2
Cholesterol: 159 mg/dL (ref 0–200)
HDL: 66.8 mg/dL (ref >39.00)
LDL Cholesterol: 70 mg/dL (ref 0–99)
NONHDL: 92.2
Triglycerides: 111 mg/dL (ref 0.0–149.0)
VLDL: 22.2 mg/dL (ref 0.0–40.0)

## 2014-07-30 LAB — CBC WITH DIFFERENTIAL/PLATELET
Basophils Absolute: 0 10*3/uL (ref 0.0–0.1)
Basophils Relative: 0.7 % (ref 0.0–3.0)
EOS PCT: 2.4 % (ref 0.0–5.0)
Eosinophils Absolute: 0.2 10*3/uL (ref 0.0–0.7)
HEMATOCRIT: 42.9 % (ref 36.0–46.0)
Hemoglobin: 14.1 g/dL (ref 12.0–15.0)
Lymphocytes Relative: 40.3 % (ref 12.0–46.0)
Lymphs Abs: 2.6 10*3/uL (ref 0.7–4.0)
MCHC: 33 g/dL (ref 30.0–36.0)
MCV: 100.5 fl — AB (ref 78.0–100.0)
MONOS PCT: 8.5 % (ref 3.0–12.0)
Monocytes Absolute: 0.6 10*3/uL (ref 0.1–1.0)
NEUTROS ABS: 3.1 10*3/uL (ref 1.4–7.7)
Neutrophils Relative %: 48.1 % (ref 43.0–77.0)
Platelets: 263 10*3/uL (ref 150.0–400.0)
RBC: 4.26 Mil/uL (ref 3.87–5.11)
RDW: 13.6 % (ref 11.5–15.5)
WBC: 6.5 10*3/uL (ref 4.0–10.5)

## 2014-07-30 LAB — AST: AST: 21 U/L (ref 0–37)

## 2014-08-02 ENCOUNTER — Ambulatory Visit (INDEPENDENT_AMBULATORY_CARE_PROVIDER_SITE_OTHER): Payer: BC Managed Care – PPO | Admitting: Internal Medicine

## 2014-08-02 VITALS — BP 122/76 | HR 54 | Ht 62.0 in | Wt 131.1 lb

## 2014-08-02 DIAGNOSIS — I10 Essential (primary) hypertension: Secondary | ICD-10-CM

## 2014-08-02 NOTE — Patient Instructions (Signed)
Your physician recommends that you continue on your current medications as directed. Please refer to the Current Medication list given to you today. Your physician wants you to follow-up in: DEC 2016 WITH DR ROSS.  You will receive a reminder letter in the mail two months in advance. If you don't receive a letter, please call our office to schedule the follow-up appointment.

## 2014-08-02 NOTE — Progress Notes (Signed)
HPI Patient is a 63 yr old with a history of mild CV disease, HL, HTN.  He was last in clinic in the fall 2014.   Plan for f/u carotid in 2016 [place] lipids LDL was 70 (07/2014) Since seen she has done well  No Cp  Breathing is OK  No dizziness Exercises regularly   No Known Allergies  Current Outpatient Prescriptions  Medication Sig Dispense Refill  . aspirin 81 MG tablet Take 81 mg by mouth daily.      . calcium carbonate 200 MG capsule 600 mg DAILY    . estradiol (CLIMARA - DOSED IN MG/24 HR) 0.05 mg/24hr Place 1 patch onto the skin once a week.      . Fish Oil-Cholecalciferol (FISH OIL + D3) 1200-1000 MG-UNIT CAPS Take by mouth. Take 1 tab twice a day     . lisinopril-hydrochlorothiazide (PRINZIDE,ZESTORETIC) 10-12.5 MG per tablet TAKE ONE TABLET BY MOUTH ONCE DAILY 90 tablet 0  . Nutritional Supplements (MELATONIN PO) Take by mouth. Daily during the week to help pt sleep     . simvastatin (ZOCOR) 20 MG tablet TAKE ONE TABLET BY MOUTH ONCE DAILY AT BEDTIME 90 tablet 2   No current facility-administered medications for this visit.    Past Medical History  Diagnosis Date  . Hypertension     Past Surgical History  Procedure Laterality Date  . Eye surgery      cataract implant surgery,right eye  . Total abdominal hysterectomy w/ bilateral salpingoophorectomy      No family history on file.  History   Social History  . Marital Status: Married    Spouse Name: N/A    Number of Children: N/A  . Years of Education: N/A   Occupational History  . Not on file.   Social History Main Topics  . Smoking status: Former Research scientist (life sciences)  . Smokeless tobacco: Never Used     Comment: trying to quit and has been 3 weeks  . Alcohol Use: Not on file  . Drug Use: Not on file  . Sexual Activity: Not on file   Other Topics Concern  . 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 She smokes one pack of cigarettes per day, down to 4  cigs per day, using a hormone patch    Review of Systems:  All systems reviewed.  They are negative to the above problem except as previously stated.  Vital Signs: BP 122/76 mmHg  Pulse 54  Ht 5\' 2"  (1.575 m)  Wt 131 lb 1.9 oz (59.476 kg)  BMI 23.98 kg/m2  Physical Exam Patient is in NAD HEENT:  Normocephalic, atraumatic. EOMI, PERRLA.  Neck: JVP is normal.  No bruits.  Lungs: clear to auscultation. No rales no wheezes.  Heart: Regular rate and rhythm. Normal S1, S2. No S3.   No significant murmurs. PMI not displaced.  Abdomen:  Supple, nontender. Normal bowel sounds. No masses. No hepatomegaly.  Extremities:   Good distal pulses throughout. No lower extremity edema.  Musculoskeletal :moving all extremities.  Neuro:   alert and oriented x3.  CN II-XII grossly intact.  EKG:  SB 54 bpm     Assessment and Plan:  1.  HTN  Good control  2.  CV disease  Mild  F/U scan in 1 year  3.  HL  Keep on statin  Excellent control   F/U 1 year

## 2014-10-22 ENCOUNTER — Other Ambulatory Visit: Payer: Self-pay | Admitting: Internal Medicine

## 2014-12-10 ENCOUNTER — Other Ambulatory Visit: Payer: Self-pay | Admitting: Physician Assistant

## 2015-01-20 ENCOUNTER — Other Ambulatory Visit: Payer: Self-pay | Admitting: Internal Medicine

## 2015-04-29 ENCOUNTER — Telehealth: Payer: Self-pay | Admitting: Internal Medicine

## 2015-04-29 DIAGNOSIS — I2510 Atherosclerotic heart disease of native coronary artery without angina pectoris: Secondary | ICD-10-CM

## 2015-04-29 DIAGNOSIS — I159 Secondary hypertension, unspecified: Secondary | ICD-10-CM

## 2015-04-29 NOTE — Telephone Encounter (Signed)
New Message   Pt calling for RN to have Dr. Harrington Challenger put an order in the system for her to have Labs   Done before we can schedule her office visit

## 2015-04-29 NOTE — Telephone Encounter (Signed)
Spoke with pt and she states that Dr. Harrington Challenger typically orders lab work prior to appt (last year had lipid, AST, CBC and BMET). Pt states that she also usually has carotid doppler every two years and is due for that (last done 07/04/2013). Will forward to Dr. Harrington Challenger for approval to order following testing and to see if there is any additional testing needed. Pt verbalized understanding.

## 2015-04-29 NOTE — Telephone Encounter (Signed)
Left message to call back  

## 2015-05-04 NOTE — Telephone Encounter (Signed)
Agree with all of above Go ahead an schedule

## 2015-05-05 NOTE — Telephone Encounter (Signed)
Pt made aware that orders will be placed for her to have her lab work and carotid dopplers completed before her appt with Dr. Harrington Challenger.  Pt told that carotid dopplers are completed at our [facility] office and that she can complete labs at that location also. Pt is due at the end of the year to see Dr. Harrington Challenger and would like these test completed before the visit. Pt information sent to scheduling to setup.

## 2015-06-25 ENCOUNTER — Encounter: Payer: Self-pay | Admitting: Internal Medicine

## 2015-07-02 ENCOUNTER — Other Ambulatory Visit: Payer: Self-pay | Admitting: Internal Medicine

## 2015-07-02 DIAGNOSIS — I6523 Occlusion and stenosis of bilateral carotid arteries: Secondary | ICD-10-CM

## 2015-07-09 ENCOUNTER — Ambulatory Visit (HOSPITAL_COMMUNITY)
Admission: RE | Admit: 2015-07-09 | Discharge: 2015-07-09 | Disposition: A | Discharge status: Home / Self Care | Payer: BLUE CROSS/BLUE SHIELD | Source: Ambulatory Visit | Attending: Cardiology | Admitting: Cardiology

## 2015-07-09 ENCOUNTER — Other Ambulatory Visit: Payer: Self-pay | Admitting: Internal Medicine

## 2015-07-09 DIAGNOSIS — I10 Essential (primary) hypertension: Secondary | ICD-10-CM | POA: Insufficient documentation

## 2015-07-09 DIAGNOSIS — I159 Secondary hypertension, unspecified: Secondary | ICD-10-CM

## 2015-07-09 DIAGNOSIS — I2510 Atherosclerotic heart disease of native coronary artery without angina pectoris: Secondary | ICD-10-CM | POA: Diagnosis not present

## 2015-07-09 DIAGNOSIS — I6523 Occlusion and stenosis of bilateral carotid arteries: Secondary | ICD-10-CM

## 2015-07-23 ENCOUNTER — Other Ambulatory Visit (INDEPENDENT_AMBULATORY_CARE_PROVIDER_SITE_OTHER): Payer: BLUE CROSS/BLUE SHIELD | Admitting: *Deleted

## 2015-07-23 DIAGNOSIS — I2510 Atherosclerotic heart disease of native coronary artery without angina pectoris: Secondary | ICD-10-CM | POA: Diagnosis not present

## 2015-07-23 DIAGNOSIS — I159 Secondary hypertension, unspecified: Secondary | ICD-10-CM

## 2015-07-23 LAB — BASIC METABOLIC PANEL WITH GFR
BUN: 20 mg/dL (ref 7–25)
CO2: 26 mmol/L (ref 20–31)
Calcium: 10.7 mg/dL — ABNORMAL HIGH (ref 8.6–10.4)
Chloride: 103 mmol/L (ref 98–110)
Creat: 0.86 mg/dL (ref 0.50–0.99)
Glucose, Bld: 99 mg/dL (ref 65–99)
Potassium: 4 mmol/L (ref 3.5–5.3)
Sodium: 139 mmol/L (ref 135–146)

## 2015-08-01 ENCOUNTER — Telehealth: Payer: Self-pay | Admitting: Internal Medicine

## 2015-08-01 NOTE — Telephone Encounter (Signed)
New Message  Pt messaged scheduling about an earlier appt- pt was placed on wait list for any appointment w/ Dr Harrington Challenger- currently sched for 12/15. Pt also requested to speak w/ RN concerning lab results- per message-- I got some results shown from my blood work, but there were no Lipid results. Any reason why? Thanks-- Please advise

## 2015-08-08 ENCOUNTER — Ambulatory Visit: Payer: Self-pay | Admitting: Internal Medicine

## 2015-08-14 ENCOUNTER — Ambulatory Visit: Payer: Self-pay | Admitting: Internal Medicine

## 2015-08-14 NOTE — Telephone Encounter (Signed)
Called patient today after discussing with lab tech. Pt had orders for bmet, cbc with diff, LFT and lipids in EPIC that were placed on 05/05/15. Her lab appointment was on 07/23/15.    Only the BMET resulted. We are not able to obtain any other results per lab tech.  Called patient to inform. She will not come back to have labs drawn. She may or may not have them drawn when she comes to see Dr. Harrington Challenger on 12/15.  She has had "too many problems" trying to get her blood work.  Two days prior she had gone to the [place] lab; only to be told that they cannot draw her blood there anymore.   Her husband is in hospice and she has many things going on.  She may not have her labs drawn at all.  I asked her to discuss with Dr. Harrington Challenger at her appointment on 12/15 if she should have lab work drawn.  Pt is in agreement.

## 2015-08-28 ENCOUNTER — Encounter: Payer: Self-pay | Admitting: Internal Medicine

## 2015-08-28 ENCOUNTER — Ambulatory Visit (INDEPENDENT_AMBULATORY_CARE_PROVIDER_SITE_OTHER): Payer: BLUE CROSS/BLUE SHIELD | Admitting: Internal Medicine

## 2015-08-28 VITALS — BP 124/76 | HR 62 | Ht 62.0 in | Wt 112.8 lb

## 2015-08-28 DIAGNOSIS — E7800 Pure hypercholesterolemia, unspecified: Secondary | ICD-10-CM

## 2015-08-28 DIAGNOSIS — I10 Essential (primary) hypertension: Secondary | ICD-10-CM

## 2015-08-28 LAB — CBC
HEMATOCRIT: 38.9 % (ref 36.0–46.0)
HEMOGLOBIN: 13.4 g/dL (ref 12.0–15.0)
MCH: 34.1 pg — AB (ref 26.0–34.0)
MCHC: 34.4 g/dL (ref 30.0–36.0)
MCV: 99 fL (ref 78.0–100.0)
MPV: 9.7 fL (ref 8.6–12.4)
Platelets: 293 10*3/uL (ref 150–400)
RBC: 3.93 MIL/uL (ref 3.87–5.11)
RDW: 14.2 % (ref 11.5–15.5)
WBC: 9.9 10*3/uL (ref 4.0–10.5)

## 2015-08-28 LAB — LIPID PANEL
CHOL/HDL RATIO: 2.4 ratio (ref <=5.0)
Cholesterol: 139 mg/dL (ref 125–200)
HDL: 57 mg/dL (ref >=46)
LDL CALC: 59 mg/dL (ref <130)
Triglycerides: 113 mg/dL (ref <150)
VLDL: 23 mg/dL (ref <30)

## 2015-08-28 LAB — BASIC METABOLIC PANEL
BUN: 14 mg/dL (ref 7–25)
CALCIUM: 10.2 mg/dL (ref 8.6–10.4)
CO2: 24 mmol/L (ref 20–31)
CREATININE: 0.77 mg/dL (ref 0.50–0.99)
Chloride: 105 mmol/L (ref 98–110)
GLUCOSE: 73 mg/dL (ref 65–99)
Potassium: 3.8 mmol/L (ref 3.5–5.3)
SODIUM: 140 mmol/L (ref 135–146)

## 2015-08-28 LAB — AST: AST: 27 U/L (ref 10–35)

## 2015-08-28 MED ORDER — ALPRAZOLAM 0.25 MG PO TABS: 0.2500 mg | ORAL_TABLET | Freq: Two times a day (BID) | ORAL | Status: DC | PRN

## 2015-08-28 NOTE — Patient Instructions (Signed)
Your physician recommends that you continue on your current medications as directed. Please refer to the Current Medication list given to you today. Your physician recommends that you return for lab work today (BMET, LIPID, CBC, AST) Your physician wants you to follow-up in: [place].  You will receive a reminder letter in the mail two months in advance. If you don't receive a letter, please call our office to schedule the follow-up appointment.

## 2015-08-28 NOTE — Progress Notes (Signed)
Cardiology Office Note   Date:  08/28/2015   ID:  SELENI RELLER, DOB Feb 07, 1951, MRN [id number]  PCP:  Sharene Butters, MD  Cardiologist:   Dorris Carnes, MD   F/U of CV dz and HTN     History of Present Illness: [place] CAMBER DRINNEN is a 64 y.o. female with a history of mild CV dz, HL, HTN   I saw him in 2015 SInce seen  Last lipids in 2015 LDL ws 70  Carotid USN in 2016 showed mild stable dz    Since seen under increased stress  Husband is ill  Her wt is down    Denies CP Breathing is OK    Current Outpatient Prescriptions  Medication Sig Dispense Refill  . aspirin 81 MG tablet Take 81 mg by mouth daily.      . calcium carbonate 200 MG capsule 600 mg DAILY    . estradiol (CLIMARA - DOSED IN MG/24 HR) 0.05 mg/24hr Place 1 patch onto the skin once a week.      . Fish Oil-Cholecalciferol (FISH OIL + D3) 1200-1000 MG-UNIT CAPS Take by mouth. Take 1 tab twice a day     . lisinopril-hydrochlorothiazide (PRINZIDE,ZESTORETIC) 10-12.5 MG per tablet TAKE ONE TABLET BY MOUTH ONCE DAILY 90 tablet 3  . Nutritional Supplements (MELATONIN PO) Take by mouth. Daily during the week to help pt sleep     . simvastatin (ZOCOR) 20 MG tablet TAKE ONE TABLET BY MOUTH AT BEDTIME 90 tablet 1   No current facility-administered medications for this visit.    Allergies:   Review of patient's allergies indicates no known allergies.   Past Medical History  Diagnosis Date  . Hypertension     Past Surgical History  Procedure Laterality Date  . Eye surgery      cataract implant surgery,right eye  . Total abdominal hysterectomy w/ bilateral salpingoophorectomy       Social History:  The patient  reports that she has quit smoking. She has never used smokeless tobacco. She reports that she does not use illicit drugs.   Family History:  The patient's family history includes Heart attack in her father; Heart disease in her brother and sister.    ROS:  Please see the history of present illness. All  other systems are reviewed and  Negative to the above problem except as noted.    PHYSICAL EXAM: VS:  BP 124/76 mmHg  Pulse 62  Ht 5\' 2"  (1.575 m)  Wt 51.166 kg (112 lb 12.8 oz)  BMI 20.63 kg/m2  GEN: Well nourished, well developed, in no acute distress HEENT: normal Neck: no JVD, carotid bruits, or masses Cardiac: RRR; no murmurs, rubs, or gallops,no edema  Respiratory:  clear to auscultation bilaterally, normal work of breathing GI: soft, nontender, nondistended, + BS  No hepatomegaly  MS: no deformity Moving all extremities   Skin: warm and dry, no rash Neuro:  Strength and sensation are intact Psych: euthymic mood, full affect   EKG:  EKG is ordered today.  SR 62 bpm  Septal infarct     Lipid Panel    Component Value Date/Time   CHOL 159 07/30/2014 0735   TRIG 111.0 07/30/2014 0735   HDL 66.80 07/30/2014 0735   CHOLHDL 2 07/30/2014 0735   VLDL 22.2 07/30/2014 0735   LDLCALC 70 07/30/2014 0735      Wt Readings from Last 3 Encounters:  08/28/15 51.166 kg (112 lb 12.8 oz)  08/02/14 59.476 kg (131 lb  1.9 oz)  08/02/13 57.607 kg (127 lb)      ASSESSMENT AND PLAN:  1  HTN Adeqaute control  2.  HL  WIll get liipds  Did have lunch  Difficult for her to come back fro true fasting  3.  CV dz  Stable mild ot mod dz of carotids  F/U in 1 year  F/U in 1 year    Wil lgive Rx for limited quantity of Xanax  0.25  Pt's husband is in hospice   Signed, Dorris Carnes, MD  08/28/2015 2:18 PM    [address] [place], [place], [place]  [address] Phone: [phone]; Fax: [phone]

## 2015-08-29 ENCOUNTER — Encounter: Payer: Self-pay | Admitting: Internal Medicine

## 2015-08-29 ENCOUNTER — Other Ambulatory Visit: Payer: Self-pay | Admitting: *Deleted

## 2015-08-29 MED ORDER — SIMVASTATIN 20 MG PO TABS: 10.0000 mg | ORAL_TABLET | Freq: Every day | ORAL | Status: DC

## 2015-10-12 ENCOUNTER — Other Ambulatory Visit: Payer: Self-pay | Admitting: Internal Medicine

## 2016-02-05 ENCOUNTER — Other Ambulatory Visit: Payer: Self-pay | Admitting: Physician Assistant

## 2016-02-13 ENCOUNTER — Encounter: Payer: Self-pay | Admitting: Internal Medicine

## 2016-02-17 NOTE — Telephone Encounter (Signed)
patinet caaed  Reviewed EKG Read by computer as septal infarct I have reviewed  Not new   I am not convinced she has had an MI  Probably lead placement   Continue medical Rx

## 2016-04-07 ENCOUNTER — Other Ambulatory Visit: Payer: Self-pay | Admitting: Internal Medicine

## 2016-06-07 ENCOUNTER — Other Ambulatory Visit: Payer: Self-pay | Admitting: Internal Medicine

## 2016-06-07 DIAGNOSIS — I6523 Occlusion and stenosis of bilateral carotid arteries: Secondary | ICD-10-CM

## 2016-06-10 ENCOUNTER — Ambulatory Visit (HOSPITAL_COMMUNITY)
Admission: RE | Admit: 2016-06-10 | Discharge: 2016-06-10 | Disposition: A | Discharge status: Home / Self Care | Payer: BLUE CROSS/BLUE SHIELD | Source: Ambulatory Visit | Attending: Cardiovascular Disease | Admitting: Cardiovascular Disease

## 2016-06-10 DIAGNOSIS — I6523 Occlusion and stenosis of bilateral carotid arteries: Secondary | ICD-10-CM | POA: Diagnosis not present

## 2016-06-10 DIAGNOSIS — I10 Essential (primary) hypertension: Secondary | ICD-10-CM | POA: Diagnosis not present

## 2016-06-10 DIAGNOSIS — Z720 Tobacco use: Secondary | ICD-10-CM | POA: Insufficient documentation

## 2016-06-10 DIAGNOSIS — E785 Hyperlipidemia, unspecified: Secondary | ICD-10-CM | POA: Diagnosis not present

## 2016-07-09 ENCOUNTER — Encounter: Payer: Self-pay | Admitting: Internal Medicine

## 2016-07-16 ENCOUNTER — Encounter: Payer: Self-pay | Admitting: Internal Medicine

## 2016-08-24 ENCOUNTER — Other Ambulatory Visit: Payer: BLUE CROSS/BLUE SHIELD

## 2016-08-27 ENCOUNTER — Ambulatory Visit: Payer: BLUE CROSS/BLUE SHIELD | Admitting: Internal Medicine

## 2017-05-12 ENCOUNTER — Encounter: Payer: Self-pay | Admitting: Podiatry

## 2017-05-12 ENCOUNTER — Ambulatory Visit (INDEPENDENT_AMBULATORY_CARE_PROVIDER_SITE_OTHER): Payer: BLUE CROSS/BLUE SHIELD | Admitting: Podiatry

## 2017-05-12 DIAGNOSIS — L603 Nail dystrophy: Secondary | ICD-10-CM

## 2017-05-12 NOTE — Progress Notes (Signed)
   Subjective:    Patient ID: Maria Rich, female    DOB: [birth year], 66 y.o.   MRN: [id number]  HPI: She presents today and states that her toenail in her big toe on her right foot is discolored and thickened. She states that she traumatized the toe approximately one year ago noticed when she removed the finger nail polish that she had a white toenail. She states that it has improved and worsened over the past year.    Review of Systems  All other systems reviewed and are negative.      Objective:   Physical Exam: Vital signs are stable alert and oriented 3. Pulses are palpable. Neurologic sensorium is intact. Deep tendon reflexes are intact. Muscle strength +5 over 5 dorsiflexion plantar flexors and inverters everters onto the musculature is intact. Orthopedic evaluation demonstrates all joints distal to the ankle for range of motion without crepitation. Cutaneous evaluation demonstrates a well-hydrated cutis. Her toenail on her hallux right is thickened and does appear to have a superficial white onychomycosis.        Assessment & Plan:  Nail dystrophy hallux right.  Plan: Samples of the skin and nail taken today to be sent for pathologic evaluation I will notify her as to those results once the pathology has returned.

## 2017-05-12 NOTE — Patient Instructions (Signed)

## 2017-05-20 ENCOUNTER — Telehealth: Payer: Self-pay | Admitting: Internal Medicine

## 2017-05-20 NOTE — Telephone Encounter (Signed)
Follow up for carotid was prn. Last year Dr. Harrington Challenger was ok with patient following with Dr. Laurann Montana.      Fay Records, MD  to [facility]       2:15 PM  I think it is fine to follow up with Dr Laurann Montana   I can be availaable as needed   Lipids have been very good BP has been very good.        Will route to Dr. Harrington Challenger to see if she should see her this year (2 yr f/u) or have pt continue to follow with Dr. Laurann Montana

## 2017-05-20 NOTE — Telephone Encounter (Signed)
New message    Pt sent a message to scheduling about scheduling his carotid, he said he's had one yearly.   Couple of things I need to address. I have been notified before to have my carotids checked and that was last done the end of September 2017, but I have not been notified so what should I do?    I have my yearly well visit with my GP, Dr. Kelton Pillar on November 14th. Does Dr. Harrington Challenger still want to see me as she had stated we could go every 2 years or should I just take this up with Dr. Laurann Montana?    Does pt need this done this year? If so, can we have an order so we can schedule him?

## 2017-05-22 NOTE — Telephone Encounter (Signed)
Pt was doing so good  Lives in [place] Pt with HTN that has been well controlled CV disease has been mild and stable Lipids excellent on last check I am happy to see her  But, if feeling ok and active and no change in numbers I think Dr Laurann Montana can follow I leave it to pt

## 2017-06-03 ENCOUNTER — Ambulatory Visit: Payer: BLUE CROSS/BLUE SHIELD | Admitting: Internal Medicine

## 2017-06-16 ENCOUNTER — Ambulatory Visit (INDEPENDENT_AMBULATORY_CARE_PROVIDER_SITE_OTHER): Payer: BLUE CROSS/BLUE SHIELD | Admitting: Podiatry

## 2017-06-16 ENCOUNTER — Encounter: Payer: Self-pay | Admitting: Podiatry

## 2017-06-16 DIAGNOSIS — L603 Nail dystrophy: Secondary | ICD-10-CM

## 2017-06-16 DIAGNOSIS — Z79899 Other long term (current) drug therapy: Secondary | ICD-10-CM

## 2017-06-16 LAB — HEPATIC FUNCTION PANEL
AG Ratio: 2 (calc) (ref 1.0–2.5)
ALBUMIN MSPROF: 4.3 g/dL (ref 3.6–5.1)
ALT: 12 U/L (ref 6–29)
AST: 18 U/L (ref 10–35)
Alkaline phosphatase (APISO): 48 U/L (ref 33–130)
BILIRUBIN DIRECT: 0.1 mg/dL (ref 0.0–0.2)
BILIRUBIN INDIRECT: 0.4 mg/dL (ref 0.2–1.2)
GLOBULIN: 2.2 g/dL (ref 1.9–3.7)
Total Bilirubin: 0.5 mg/dL (ref 0.2–1.2)
Total Protein: 6.5 g/dL (ref 6.1–8.1)

## 2017-06-16 MED ORDER — TERBINAFINE HCL 250 MG PO TABS: 250.0000 mg | ORAL_TABLET | Freq: Every day | ORAL | 0 refills | Status: DC

## 2017-06-16 NOTE — Patient Instructions (Signed)

## 2017-06-17 NOTE — Progress Notes (Signed)
Maria Rich presents today for follow-up of her onychomycosis.  Objective: Pathology report demonstrates onychomycosis separate phytic type.  Assessment: Onychomycosis.  Plan: We discussed the pros and cons of laser therapy and oral therapy today she would like to see her oral therapy and proceed with that. At this point we started her on Lamisil 250 mg tablets 1 by mouth daily and also send her out with a requisition for blood work liver profile. Follow-up with me in 11 month. Call if questions or concerns.

## 2017-06-22 ENCOUNTER — Telehealth: Payer: Self-pay | Admitting: *Deleted

## 2017-06-22 NOTE — Telephone Encounter (Addendum)
-----   Message from Garrel Ridgel, [place] sent at 06/17/2017  5:26 PM EDT ----- Blood work looks good and should continue to take medication. 06/22/2017-I informed pt of Dr. Stephenie Acres review of results and orders. Pt states she is concerned she will run out of the prescription prior to her appt with Dr. Milinda Pointer. I told pt the rx stayed in her system for 14-30 days. Pt states understanding.

## 2017-07-18 ENCOUNTER — Telehealth: Payer: Self-pay | Admitting: Podiatry

## 2017-07-18 NOTE — Telephone Encounter (Signed)
I'm a pt of Dr. Stephenie Acres and the medication he prescribed me ran out yesterday. I'm not scheduled to see him until this Thursday. My question is do I need anything before I come in. You can reach me at [phone].

## 2017-07-18 NOTE — Telephone Encounter (Signed)
I informed pt that terbinafine would stay in her system for at least 2 weeks.

## 2017-07-20 ENCOUNTER — Other Ambulatory Visit: Payer: Self-pay | Admitting: Physician Assistant

## 2017-07-21 ENCOUNTER — Ambulatory Visit (INDEPENDENT_AMBULATORY_CARE_PROVIDER_SITE_OTHER): Payer: BLUE CROSS/BLUE SHIELD | Admitting: Podiatry

## 2017-07-21 ENCOUNTER — Encounter: Payer: Self-pay | Admitting: Podiatry

## 2017-07-21 DIAGNOSIS — L603 Nail dystrophy: Secondary | ICD-10-CM | POA: Diagnosis not present

## 2017-07-21 DIAGNOSIS — Z79899 Other long term (current) drug therapy: Secondary | ICD-10-CM

## 2017-07-21 LAB — HEPATIC FUNCTION PANEL
AG RATIO: 1.9 (calc) (ref 1.0–2.5)
ALBUMIN MSPROF: 4.4 g/dL (ref 3.6–5.1)
ALT: 11 U/L (ref 6–29)
AST: 18 U/L (ref 10–35)
Alkaline phosphatase (APISO): 57 U/L (ref 33–130)
Bilirubin, Direct: 0.1 mg/dL (ref 0.0–0.2)
GLOBULIN: 2.3 g/dL (ref 1.9–3.7)
Indirect Bilirubin: 0.3 mg/dL (calc) (ref 0.2–1.2)
TOTAL PROTEIN: 6.7 g/dL (ref 6.1–8.1)
Total Bilirubin: 0.4 mg/dL (ref 0.2–1.2)

## 2017-07-21 MED ORDER — TERBINAFINE HCL 250 MG PO TABS: 250.0000 mg | ORAL_TABLET | Freq: Every day | ORAL | 0 refills | Status: DC

## 2017-07-21 NOTE — Progress Notes (Signed)
He presents today states that he completed his first 30 days of Lamisil without any problems. He denies fever chills nausea vomiting muscle aches pains rashes discoloration or itching.  Objective: Vital signs are stable he is alert and oriented 3 no change in the nail plates yet.  Assessment: Onychomycosis and treated long-term therapy Lamisil.  Plan:  requested another liver profile and prescribed another 90 days of medication. I will follow-up with him in 4 months.

## 2017-07-25 ENCOUNTER — Telehealth: Payer: Self-pay | Admitting: *Deleted

## 2017-07-25 NOTE — Telephone Encounter (Signed)
-----   Message from Garrel Ridgel, [place] sent at 07/25/2017  6:53 AM EST ----- Blood work looks great and may continue medication.

## 2017-07-25 NOTE — Telephone Encounter (Signed)
I informed pt of Dr. Hyatt's review of results and orders. 

## 2017-10-19 ENCOUNTER — Telehealth: Payer: Self-pay | Admitting: Podiatry

## 2017-10-19 ENCOUNTER — Other Ambulatory Visit: Payer: Self-pay | Admitting: Internal Medicine

## 2017-10-19 NOTE — Telephone Encounter (Signed)
I'm a pt of Dr. Stephenie Acres and I'm not scheduled to see him again until 14 March. I'm inquiring about the terbinafine prescription. I'm almost out and I only have a few pills left. I did not know if I should get this refilled or not. Please call me back at [phone]. Thank you.

## 2017-10-19 NOTE — Telephone Encounter (Signed)
Left message informing pt that she had received 90 doses of the medication she was requesting and that was therapeutic, Dr. Milinda Pointer would evaluate at the next appt if refill is needed.

## 2017-11-24 ENCOUNTER — Encounter: Payer: Self-pay | Admitting: Podiatry

## 2017-11-24 ENCOUNTER — Ambulatory Visit (INDEPENDENT_AMBULATORY_CARE_PROVIDER_SITE_OTHER): Payer: BLUE CROSS/BLUE SHIELD | Admitting: Podiatry

## 2017-11-24 DIAGNOSIS — L603 Nail dystrophy: Secondary | ICD-10-CM | POA: Diagnosis not present

## 2017-11-24 MED ORDER — TERBINAFINE HCL 250 MG PO TABS: 250.0000 mg | ORAL_TABLET | Freq: Every day | ORAL | 0 refills | Status: DC

## 2017-11-24 NOTE — Patient Instructions (Signed)
Dr. Hyatt has sent over a refill for Lamisil to your pharmacy today. The instructions on your bottle will say "take 1 tablet daily", however, he would like for you to take one pill every other day. He will follow up with you in 3 months to re-evaluate your toenails. 

## 2017-11-26 NOTE — Progress Notes (Signed)
She presents today for follow-up of her onychomycosis she states that they look a lot better than they did she completed her 120 days of Lamisil without any complications from the medication whatsoever.  She states that she is happy that they are doing well.  She states that they did seem to be some gross slow-growing so they have not cleared 100% as of yet.  Objective: Vital signs are stable she is alert and oriented x3 slowly resolving onychomycosis with nail dystrophy.  Assessment: Onychomycosis with nail dystrophy slowly resolving with the use of Lamisil.  Plan: I would keep her on Lamisil 1 tablet every other day for the next 2 months and I will follow-up with her in 3 months.

## 2018-02-23 ENCOUNTER — Encounter: Payer: Self-pay | Admitting: Podiatry

## 2018-02-23 ENCOUNTER — Ambulatory Visit (INDEPENDENT_AMBULATORY_CARE_PROVIDER_SITE_OTHER): Payer: BLUE CROSS/BLUE SHIELD | Admitting: Podiatry

## 2018-02-23 DIAGNOSIS — L603 Nail dystrophy: Secondary | ICD-10-CM

## 2018-02-23 MED ORDER — FLUCONAZOLE 150 MG PO TABS: 300.0000 mg | ORAL_TABLET | ORAL | 1 refills | Status: DC

## 2018-02-23 NOTE — Progress Notes (Signed)
She presents today for follow-up of her Lamisil therapy.  She is finished about 6 months worth of it.  States that really has not gotten much at all but is turned more quite.  Objective: Vital signs are stable she is alert and oriented x3 hallux right does demonstrate a dystrophic nail with onychomycosis what appears to be now quite onychomycosis or even yeast.  Assessment possible yeast infection after resolving onychomycosis.  Plan: Started her on diclofenac.  She will take 2 tablets once a week for the next 3 months I will see her at that time.

## 2018-05-30 ENCOUNTER — Ambulatory Visit (INDEPENDENT_AMBULATORY_CARE_PROVIDER_SITE_OTHER): Payer: Medicare Other | Admitting: Podiatry

## 2018-05-30 ENCOUNTER — Encounter: Payer: Self-pay | Admitting: Podiatry

## 2018-05-30 DIAGNOSIS — L603 Nail dystrophy: Secondary | ICD-10-CM | POA: Diagnosis not present

## 2018-05-30 MED ORDER — FLUCONAZOLE 150 MG PO TABS: 300.0000 mg | ORAL_TABLET | ORAL | 0 refills | Status: DC

## 2018-05-31 NOTE — Progress Notes (Signed)
She presents today for follow-up of her fungus.  She states that the seems to be growing out as long as I take the medication.  Have not had any problems taking the medicine.  Objective: Vital signs are stable she is alert and oriented x3.  Pulses are palpable.  It appears that the hallux nail right is growing out normally.  Assessment: Resolving yeast infection.  Plan: Continue the fluconazole 2 tablets once a week for the next 3 months follow-up with her at that time.

## 2018-06-06 DIAGNOSIS — Z23 Encounter for immunization: Secondary | ICD-10-CM | POA: Diagnosis not present

## 2018-07-18 ENCOUNTER — Telehealth: Payer: Self-pay | Admitting: Internal Medicine

## 2018-07-18 NOTE — Telephone Encounter (Signed)
New Message         Patient is calling to see if she is suppose to have a ultra sound for the [place] done? Pls call and advise.

## 2018-07-19 ENCOUNTER — Other Ambulatory Visit: Payer: Self-pay | Admitting: *Deleted

## 2018-07-19 DIAGNOSIS — I7090 Unspecified atherosclerosis: Secondary | ICD-10-CM

## 2018-07-19 NOTE — Telephone Encounter (Signed)
Order placed for carotid. Responded to patient today after she sent MyChart message.

## 2018-08-01 DIAGNOSIS — I2510 Atherosclerotic heart disease of native coronary artery without angina pectoris: Secondary | ICD-10-CM | POA: Diagnosis not present

## 2018-08-01 DIAGNOSIS — Z1159 Encounter for screening for other viral diseases: Secondary | ICD-10-CM | POA: Diagnosis not present

## 2018-08-01 DIAGNOSIS — I779 Disorder of arteries and arterioles, unspecified: Secondary | ICD-10-CM | POA: Diagnosis not present

## 2018-08-01 DIAGNOSIS — E7800 Pure hypercholesterolemia, unspecified: Secondary | ICD-10-CM | POA: Diagnosis not present

## 2018-08-01 DIAGNOSIS — Z0000 Encounter for general adult medical examination without abnormal findings: Secondary | ICD-10-CM | POA: Diagnosis not present

## 2018-08-01 DIAGNOSIS — Z1231 Encounter for screening mammogram for malignant neoplasm of breast: Secondary | ICD-10-CM | POA: Diagnosis not present

## 2018-08-01 DIAGNOSIS — Z1389 Encounter for screening for other disorder: Secondary | ICD-10-CM | POA: Diagnosis not present

## 2018-08-01 DIAGNOSIS — I119 Hypertensive heart disease without heart failure: Secondary | ICD-10-CM | POA: Diagnosis not present

## 2018-08-01 DIAGNOSIS — N951 Menopausal and female climacteric states: Secondary | ICD-10-CM | POA: Diagnosis not present

## 2018-08-02 ENCOUNTER — Ambulatory Visit (HOSPITAL_COMMUNITY)
Admission: RE | Admit: 2018-08-02 | Discharge: 2018-08-02 | Disposition: A | Discharge status: Home / Self Care | Payer: Medicare Other | Source: Ambulatory Visit | Attending: Internal Medicine | Admitting: Internal Medicine

## 2018-08-02 ENCOUNTER — Other Ambulatory Visit: Payer: Self-pay | Admitting: Internal Medicine

## 2018-08-02 DIAGNOSIS — I6529 Occlusion and stenosis of unspecified carotid artery: Secondary | ICD-10-CM

## 2018-08-02 DIAGNOSIS — I7090 Unspecified atherosclerosis: Secondary | ICD-10-CM

## 2018-08-03 ENCOUNTER — Telehealth: Payer: Self-pay

## 2018-08-03 NOTE — Telephone Encounter (Signed)
-----   Message from Fay Records, MD sent at 08/02/2018  3:15 PM EST ----- Mild plaquing of internal carotid arteries   Not significantly changed

## 2018-08-03 NOTE — Telephone Encounter (Signed)
LMTCB

## 2018-08-28 ENCOUNTER — Encounter: Payer: Self-pay | Admitting: Internal Medicine

## 2018-08-28 ENCOUNTER — Ambulatory Visit (INDEPENDENT_AMBULATORY_CARE_PROVIDER_SITE_OTHER): Payer: Medicare Other | Admitting: Internal Medicine

## 2018-08-28 VITALS — BP 120/62 | HR 66 | Ht 62.0 in | Wt 118.8 lb

## 2018-08-28 DIAGNOSIS — E349 Endocrine disorder, unspecified: Secondary | ICD-10-CM | POA: Diagnosis not present

## 2018-08-28 DIAGNOSIS — I679 Cerebrovascular disease, unspecified: Secondary | ICD-10-CM | POA: Diagnosis not present

## 2018-08-28 DIAGNOSIS — Z90710 Acquired absence of both cervix and uterus: Secondary | ICD-10-CM | POA: Diagnosis not present

## 2018-08-28 DIAGNOSIS — I6529 Occlusion and stenosis of unspecified carotid artery: Secondary | ICD-10-CM

## 2018-08-28 DIAGNOSIS — Z8262 Family history of osteoporosis: Secondary | ICD-10-CM | POA: Diagnosis not present

## 2018-08-28 DIAGNOSIS — I10 Essential (primary) hypertension: Secondary | ICD-10-CM | POA: Diagnosis not present

## 2018-08-28 DIAGNOSIS — E782 Mixed hyperlipidemia: Secondary | ICD-10-CM | POA: Diagnosis not present

## 2018-08-28 DIAGNOSIS — Z780 Asymptomatic menopausal state: Secondary | ICD-10-CM | POA: Diagnosis not present

## 2018-08-28 NOTE — Progress Notes (Signed)
Cardiology Office Note   Date:  08/28/2018   ID:  Maria Rich, DOB 1950-10-21, MRN [id number]  PCP:  Maria Pillar, MD  Cardiologist:   Maria Carnes, MD   F/U of CV dz and HTN     History of Present Illness: Maria Rich is a 67 y.o. female with a history of mild CV dz, HL, HTN   I saw him in 20156   Plan was for prn follow up    The pt schedule return today   Active   No CP   No SOB   Occasional dizziness with quick standing    Very busy     Widowed in 2017    Noevember   LDL 70   HDL 64     Current Outpatient Medications  Medication Sig Dispense Refill  . Maria Rich 0.5 ML injection ADM 0.5ML IM UTD  0  . aspirin 81 MG tablet Take 81 mg by mouth daily.      . calcium carbonate 200 MG capsule 600 mg DAILY    . Fish Oil-Cholecalciferol (FISH OIL + D3) 1200-1000 MG-UNIT CAPS Take by mouth. Take 1 tab twice a day     . fluconazole (DIFLUCAN) 150 MG tablet Take 2 tablets (300 mg total) by mouth once a week. 24 tablet 0  . lisinopril-hydrochlorothiazide (PRINZIDE,ZESTORETIC) 10-12.5 MG tablet TAKE ONE TABLET BY MOUTH ONCE DAILY 90 tablet 2  . Nutritional Supplements (MELATONIN PO) Take by mouth. Daily during the week to help pt sleep     . simvastatin (ZOCOR) 20 MG tablet TAKE ONE TABLET BY MOUTH AT BEDTIME 90 tablet 1   No current facility-administered medications for this visit.     Allergies:   No known allergies   Past Medical History:  Diagnosis Date  . Hypertension     Past Surgical History:  Procedure Laterality Date  . EYE SURGERY     cataract implant surgery,right eye  . TOTAL ABDOMINAL HYSTERECTOMY W/ BILATERAL SALPINGOOPHORECTOMY       Social History:  The patient  reports that she has quit smoking. She has never used smokeless tobacco. She reports that she does not use drugs.   Family History:  The patient's family history includes Heart attack in her father; Heart disease in her brother and sister.    ROS:  Please see the history of  present illness. All other systems are reviewed and  Negative to the above problem except as noted.    PHYSICAL EXAM: VS:  BP 120/62   Pulse 66   Ht 5\' 2"  (1.575 m)   Wt 118 lb 12.8 oz (53.9 kg)   BMI 21.73 kg/m   GEN:  Thin 67 yo  in no acute distress HEENT: normal Neck: no JVD, carotid bruits, or masses Cardiac: RRR; no murmurs, rubs, or gallops,no edema  Respiratory:  clear to auscultation bilaterally, normal work of breathing GI: soft, nontender, nondistended, + BS  No hepatomegaly  MS: no deformity Moving all extremities   Skin: warm and dry, no rash Neuro:  Strength and sensation are intact Psych: euthymic mood, full affect   EKG:  EKG is ordered today.  SR 66 bpm  Septal infarct     Lipid Panel    Component Value Date/Time   CHOL 139 08/28/2015 1447   TRIG 113 08/28/2015 1447   HDL 57 08/28/2015 1447   CHOLHDL 2.4 08/28/2015 1447   VLDL 23 08/28/2015 1447   LDLCALC 59 08/28/2015 1447  Wt Readings from Last 3 Encounters:  08/28/18 118 lb 12.8 oz (53.9 kg)  08/28/15 112 lb 12.8 oz (51.2 kg)  08/02/14 131 lb 1.9 oz (59.5 kg)      ASSESSMENT AND PLAN:  1  HTN Good control     2.  HL  LIpids are good     3.  CV dz  Mild CV  dz   Continue statin     F/U in 2 years    Signed, Maria Carnes, MD  08/28/2018 2:56 PM    [address], [place], [place]  [address] Phone: [phone]; Fax: [phone]

## 2018-08-28 NOTE — Patient Instructions (Signed)
Medication Instructions:  No change If you need a refill on your cardiac medications before your next appointment, please call your pharmacy.   Lab work: none If you have labs (blood work) drawn today and your tests are completely normal, you will receive your results only by: Marland Kitchen MyChart Message (if you have MyChart) OR . A paper copy in the mail If you have any lab test that is abnormal or we need to change your treatment, we will call you to review the results.  Testing/Procedures: none  Follow-Up: At [facility], you and your health needs are our priority.  As part of our continuing mission to provide you with exceptional heart care, we have created designated Provider Care Teams.  These Care Teams include your primary Cardiologist (physician) and Advanced Practice Providers (APPs -  Physician Assistants and Nurse Practitioners) who all work together to provide you with the care you need, when you need it. You will need a follow up appointment in:  2 years.  Please call our office 2 months in advance to schedule this appointment.  You may see Dr. Harrington Challenger or one of the following Advanced Practice Providers on your designated Care Team: Richardson Dopp, PA-C [place], [place] . Daune Perch, NP  Any Other Special Instructions Will Be Listed Below (If Applicable).

## 2018-08-29 ENCOUNTER — Ambulatory Visit (INDEPENDENT_AMBULATORY_CARE_PROVIDER_SITE_OTHER): Payer: Medicare Other | Admitting: Podiatry

## 2018-08-29 ENCOUNTER — Encounter: Payer: Self-pay | Admitting: Podiatry

## 2018-08-29 DIAGNOSIS — I6529 Occlusion and stenosis of unspecified carotid artery: Secondary | ICD-10-CM | POA: Diagnosis not present

## 2018-08-29 DIAGNOSIS — L603 Nail dystrophy: Secondary | ICD-10-CM | POA: Diagnosis not present

## 2018-08-29 MED ORDER — FLUCONAZOLE 150 MG PO TABS: 300.0000 mg | ORAL_TABLET | ORAL | 0 refills | Status: DC

## 2018-08-29 NOTE — Addendum Note (Signed)
Addended by: Patterson Hammersmith A on: 08/29/2018 11:46 AM   Modules accepted: Orders

## 2018-08-29 NOTE — Progress Notes (Signed)
She presents today for follow-up of her nail fungus she is completed 6 months of fluconazole states that I think is starting to grow out a little bit and she reports it is about halfway out.  Objective: Vital signs are stable she alert and oriented x3.  There is no erythema there is still a strange odor hallux nail right appears to be growing out but not 50% clear proximally.  Assessment: Resolving onychomycosis yeast infection.  Plan: Continue fluconazole 2 tablets once a week for the next 3 months.

## 2018-08-30 ENCOUNTER — Telehealth: Payer: Self-pay | Admitting: Podiatry

## 2018-08-30 MED ORDER — FLUCONAZOLE 150 MG PO TABS: 300.0000 mg | ORAL_TABLET | ORAL | 0 refills | Status: DC

## 2018-08-30 NOTE — Telephone Encounter (Signed)
Pt was seen yesterday and prescription was supposed to be sent in. Pharmacy has not received the prescription. Please give pt a call.

## 2018-08-30 NOTE — Telephone Encounter (Signed)
I informed pt the rx was sent to the [facility] in [place] 08/29/2018 11:21am. Pt [facility] [id number]

## 2018-08-30 NOTE — Addendum Note (Signed)
Addended by: Harriett Sine D on: 08/30/2018 03:23 PM   Modules accepted: Orders

## 2018-09-07 ENCOUNTER — Encounter: Payer: Self-pay | Admitting: Podiatry

## 2018-11-07 ENCOUNTER — Encounter: Payer: Self-pay | Admitting: Podiatry

## 2018-11-14 ENCOUNTER — Ambulatory Visit (INDEPENDENT_AMBULATORY_CARE_PROVIDER_SITE_OTHER): Payer: Medicare Other | Admitting: Podiatry

## 2018-11-14 ENCOUNTER — Encounter: Payer: Self-pay | Admitting: Podiatry

## 2018-11-14 DIAGNOSIS — L603 Nail dystrophy: Secondary | ICD-10-CM

## 2018-11-14 MED ORDER — FLUCONAZOLE 150 MG PO TABS: 300.0000 mg | ORAL_TABLET | ORAL | 0 refills | Status: DC

## 2018-11-14 NOTE — Progress Notes (Signed)
Presents today for follow-up of her nail fungus.  States that she is almost out of her fluconazole.  States that she has a little burning in her toe and would like to have it checked out before she goes to [place].  Objective: Vital signs are stable alert and oriented x3 she has a small spicule of nail along the fibular border of the hallux right.  Toenail appears to be healing nicely.  Assessment: Slow resolution of onychomycosis due to yeast.  Plan: Continue another 3 months worth of fluconazole follow-up with her after she returns from [place].

## 2018-12-05 ENCOUNTER — Ambulatory Visit: Payer: Medicare Other | Admitting: Podiatry

## 2019-02-12 ENCOUNTER — Encounter: Payer: Self-pay | Admitting: Podiatry

## 2019-02-15 ENCOUNTER — Ambulatory Visit: Payer: Medicare Other | Admitting: Podiatry

## 2019-03-01 ENCOUNTER — Telehealth: Payer: Self-pay | Admitting: Podiatry

## 2019-03-01 NOTE — Telephone Encounter (Signed)
Patient called to r/s her appointment today and would like to have a refill on her diflucan medication. Please give patient a call

## 2019-03-01 NOTE — Telephone Encounter (Signed)
That is okay to refill.

## 2019-03-02 MED ORDER — FLUCONAZOLE 150 MG PO TABS: 300.0000 mg | ORAL_TABLET | ORAL | 0 refills | Status: DC

## 2019-03-02 NOTE — Telephone Encounter (Signed)
I informed pt the diflucan had been refilled and [facility]

## 2019-03-02 NOTE — Addendum Note (Signed)
Addended by: Harriett Sine D on: 03/02/2019 09:48 AM   Modules accepted: Orders

## 2019-03-21 DIAGNOSIS — L570 Actinic keratosis: Secondary | ICD-10-CM | POA: Diagnosis not present

## 2019-03-21 DIAGNOSIS — D229 Melanocytic nevi, unspecified: Secondary | ICD-10-CM | POA: Diagnosis not present

## 2019-03-23 DIAGNOSIS — K621 Rectal polyp: Secondary | ICD-10-CM | POA: Diagnosis not present

## 2019-03-23 DIAGNOSIS — D120 Benign neoplasm of cecum: Secondary | ICD-10-CM | POA: Diagnosis not present

## 2019-03-23 DIAGNOSIS — Z1211 Encounter for screening for malignant neoplasm of colon: Secondary | ICD-10-CM | POA: Diagnosis not present

## 2019-03-23 DIAGNOSIS — K5730 Diverticulosis of large intestine without perforation or abscess without bleeding: Secondary | ICD-10-CM | POA: Diagnosis not present

## 2019-03-23 DIAGNOSIS — D123 Benign neoplasm of transverse colon: Secondary | ICD-10-CM | POA: Diagnosis not present

## 2019-03-23 DIAGNOSIS — D125 Benign neoplasm of sigmoid colon: Secondary | ICD-10-CM | POA: Diagnosis not present

## 2019-03-27 DIAGNOSIS — D120 Benign neoplasm of cecum: Secondary | ICD-10-CM | POA: Diagnosis not present

## 2019-03-27 DIAGNOSIS — D125 Benign neoplasm of sigmoid colon: Secondary | ICD-10-CM | POA: Diagnosis not present

## 2019-03-27 DIAGNOSIS — D123 Benign neoplasm of transverse colon: Secondary | ICD-10-CM | POA: Diagnosis not present

## 2019-03-27 DIAGNOSIS — K621 Rectal polyp: Secondary | ICD-10-CM | POA: Diagnosis not present

## 2019-04-05 ENCOUNTER — Ambulatory Visit: Payer: Medicare Other | Admitting: Podiatry

## 2019-05-18 DIAGNOSIS — Z23 Encounter for immunization: Secondary | ICD-10-CM | POA: Diagnosis not present

## 2019-08-06 DIAGNOSIS — Z1231 Encounter for screening mammogram for malignant neoplasm of breast: Secondary | ICD-10-CM | POA: Diagnosis not present

## 2019-08-13 DIAGNOSIS — B009 Herpesviral infection, unspecified: Secondary | ICD-10-CM | POA: Diagnosis not present

## 2019-08-13 DIAGNOSIS — I779 Disorder of arteries and arterioles, unspecified: Secondary | ICD-10-CM | POA: Diagnosis not present

## 2019-08-13 DIAGNOSIS — E7800 Pure hypercholesterolemia, unspecified: Secondary | ICD-10-CM | POA: Diagnosis not present

## 2019-08-13 DIAGNOSIS — I119 Hypertensive heart disease without heart failure: Secondary | ICD-10-CM | POA: Diagnosis not present

## 2019-08-13 DIAGNOSIS — K635 Polyp of colon: Secondary | ICD-10-CM | POA: Diagnosis not present

## 2019-08-13 DIAGNOSIS — I2510 Atherosclerotic heart disease of native coronary artery without angina pectoris: Secondary | ICD-10-CM | POA: Diagnosis not present

## 2019-08-13 DIAGNOSIS — Z0000 Encounter for general adult medical examination without abnormal findings: Secondary | ICD-10-CM | POA: Diagnosis not present

## 2019-08-16 DIAGNOSIS — E7800 Pure hypercholesterolemia, unspecified: Secondary | ICD-10-CM | POA: Diagnosis not present

## 2019-09-24 DIAGNOSIS — Z961 Presence of intraocular lens: Secondary | ICD-10-CM | POA: Diagnosis not present

## 2019-09-24 DIAGNOSIS — H40013 Open angle with borderline findings, low risk, bilateral: Secondary | ICD-10-CM | POA: Diagnosis not present

## 2019-09-24 DIAGNOSIS — H43812 Vitreous degeneration, left eye: Secondary | ICD-10-CM | POA: Diagnosis not present

## 2019-10-03 ENCOUNTER — Ambulatory Visit: Payer: Medicare Other | Attending: Internal Medicine

## 2019-10-03 DIAGNOSIS — Z23 Encounter for immunization: Secondary | ICD-10-CM | POA: Insufficient documentation

## 2019-10-03 NOTE — Progress Notes (Signed)
   Covid-19 Vaccination Clinic  Name:  Maria Rich    MRN: [id number] DOB: 01/17/1951  10/03/2019  Ms. Train was observed post Covid-19 immunization for 15 minutes without incidence. She was provided with Vaccine Information Sheet and instruction to access the V-Safe system.   [name] was instructed to call 911 with any severe reactions post vaccine: Marland Kitchen Difficulty breathing  . Swelling of your face and throat  . A fast heartbeat  . A bad rash all over your body  . Dizziness and weakness    Immunizations Administered    Name Date Dose VIS Date Route   Pfizer COVID-19 Vaccine 10/03/2019  5:01 PM 0.3 mL 08/24/2019 Intramuscular   Manufacturer: [place]   Lot: [id number]   [place]: [id number]

## 2019-10-24 ENCOUNTER — Ambulatory Visit: Payer: Medicare Other | Attending: Internal Medicine

## 2019-10-24 DIAGNOSIS — Z23 Encounter for immunization: Secondary | ICD-10-CM | POA: Insufficient documentation

## 2019-10-24 NOTE — Progress Notes (Signed)
   Covid-19 Vaccination Clinic  Name:  Maria Rich    MRN: [id number] DOB: 1950/10/06  10/24/2019  [name] was observed post Covid-19 immunization for 15 minutes without incidence. She was provided with Vaccine Information Sheet and instruction to access the V-Safe system.   [name] was instructed to call 911 with any severe reactions post vaccine: Marland Kitchen Difficulty breathing  . Swelling of your face and throat  . A fast heartbeat  . A bad rash all over your body  . Dizziness and weakness    Immunizations Administered    Name Date Dose VIS Date Route   Pfizer COVID-19 Vaccine 10/24/2019 10:34 AM 0.3 mL 08/24/2019 Intramuscular   Manufacturer: [place]   Lot: [id number]   [place]: [id number]

## 2019-12-11 DIAGNOSIS — M1612 Unilateral primary osteoarthritis, left hip: Secondary | ICD-10-CM | POA: Diagnosis not present

## 2020-01-10 DIAGNOSIS — M1612 Unilateral primary osteoarthritis, left hip: Secondary | ICD-10-CM | POA: Diagnosis not present

## 2020-01-10 DIAGNOSIS — M5442 Lumbago with sciatica, left side: Secondary | ICD-10-CM | POA: Diagnosis not present

## 2020-01-10 DIAGNOSIS — M7062 Trochanteric bursitis, left hip: Secondary | ICD-10-CM | POA: Diagnosis not present

## 2020-01-10 DIAGNOSIS — M545 Low back pain: Secondary | ICD-10-CM | POA: Diagnosis not present

## 2020-02-14 DIAGNOSIS — M25552 Pain in left hip: Secondary | ICD-10-CM | POA: Diagnosis not present

## 2020-02-14 DIAGNOSIS — M545 Low back pain: Secondary | ICD-10-CM | POA: Diagnosis not present

## 2020-02-14 DIAGNOSIS — M5416 Radiculopathy, lumbar region: Secondary | ICD-10-CM | POA: Diagnosis not present

## 2020-02-22 DIAGNOSIS — M5416 Radiculopathy, lumbar region: Secondary | ICD-10-CM | POA: Diagnosis not present

## 2020-02-22 DIAGNOSIS — M545 Low back pain: Secondary | ICD-10-CM | POA: Diagnosis not present

## 2020-02-22 DIAGNOSIS — M25552 Pain in left hip: Secondary | ICD-10-CM | POA: Diagnosis not present

## 2020-02-26 DIAGNOSIS — M545 Low back pain: Secondary | ICD-10-CM | POA: Diagnosis not present

## 2020-02-26 DIAGNOSIS — M5416 Radiculopathy, lumbar region: Secondary | ICD-10-CM | POA: Diagnosis not present

## 2020-02-26 DIAGNOSIS — M25552 Pain in left hip: Secondary | ICD-10-CM | POA: Diagnosis not present

## 2020-02-27 ENCOUNTER — Encounter: Payer: Self-pay | Admitting: *Deleted

## 2020-03-07 ENCOUNTER — Ambulatory Visit: Payer: BLUE CROSS/BLUE SHIELD | Admitting: Physician Assistant

## 2020-03-14 DIAGNOSIS — M545 Low back pain: Secondary | ICD-10-CM | POA: Diagnosis not present

## 2020-04-09 DIAGNOSIS — M5416 Radiculopathy, lumbar region: Secondary | ICD-10-CM | POA: Diagnosis not present

## 2020-05-01 ENCOUNTER — Ambulatory Visit: Payer: BLUE CROSS/BLUE SHIELD | Admitting: Physician Assistant

## 2020-05-01 ENCOUNTER — Other Ambulatory Visit: Payer: Self-pay | Admitting: Neurological Surgery

## 2020-05-01 DIAGNOSIS — I10 Essential (primary) hypertension: Secondary | ICD-10-CM | POA: Diagnosis not present

## 2020-05-01 DIAGNOSIS — M7130 Other bursal cyst, unspecified site: Secondary | ICD-10-CM | POA: Diagnosis not present

## 2020-05-01 DIAGNOSIS — M5417 Radiculopathy, lumbosacral region: Secondary | ICD-10-CM | POA: Diagnosis not present

## 2020-05-05 NOTE — Pre-Procedure Instructions (Signed)
Maria Rich  05/05/2020      [id number] [place], [place] - [address] [facility] [address] Phone: [phone] Fax: [phone]    Your procedure is scheduled on Aug. 27  Report to [facility] Entrance A at 7:45 A.M.  Call this number if you have problems the morning of surgery:  [phone]   Remember:  Do not eat or drink after midnight.      Take these medicines the morning of surgery with A SIP OF WATER :               Tramadol if needed               7 days prior to surgery STOP taking any Aspirin (unless otherwise instructed by your surgeon), Aleve, Naproxen, Ibuprofen, Motrin, Advil, Goody's, BC's, all herbal medications, fish oil, and all vitamins, meloxican/mobic                Follow your surgeon's instructions on when to stop Aspirin.  If no instructions were given by your surgeon then you will need to call the office to get those instructions.      Do not wear jewelry, make-up or nail polish.  Do not wear lotions, powders, or perfumes, or deodorant.  Do not shave 48 hours prior to surgery.  Men may shave face and neck.  Do not bring valuables to the hospital.  [facility] is not responsible for any belongings or valuables.  Contacts, dentures or bridgework may not be worn into surgery.  Leave your suitcase in the car.  After surgery it may be brought to your room.  For patients admitted to the hospital, discharge time will be determined by your treatment team.  Patients discharged the day of surgery will not be allowed to drive home.   Name and phone number of your driver:   Special instructions: [place]- Preparing For Surgery  Before surgery, you can play an important role. Because skin is not sterile, your skin needs to be as free of germs as possible. You can reduce the number of germs on your skin by washing with CHG (chlorahexidine gluconate) Soap before surgery.  CHG is an antiseptic cleaner which kills germs  and bonds with the skin to continue killing germs even after washing.    Oral Hygiene is also important to reduce your risk of infection.  Remember - BRUSH YOUR TEETH THE MORNING OF SURGERY WITH YOUR REGULAR TOOTHPASTE  Please do not use if you have an allergy to CHG or antibacterial soaps. If your skin becomes reddened/irritated stop using the CHG.  Do not shave (including legs and underarms) for at least 48 hours prior to first CHG shower. It is OK to shave your face.  Please follow these instructions carefully.   1. Shower the NIGHT BEFORE SURGERY and the MORNING OF SURGERY with CHG.   2. If you chose to wash your hair, wash your hair first as usual with your normal shampoo.  3. After you shampoo, rinse your hair and body thoroughly to remove the shampoo.  4. Use CHG as you would any other liquid soap. You can apply CHG directly to the skin and wash gently with a scrungie or a clean washcloth.   5. Apply the CHG Soap to your body ONLY FROM THE NECK DOWN.  Do not use on open wounds or open sores. Avoid contact with your eyes, ears, mouth and genitals (private  parts). Wash Face and genitals (private parts)  with your normal soap.  6. Wash thoroughly, paying special attention to the area where your surgery will be performed.  7. Thoroughly rinse your body with warm water from the neck down.  8. DO NOT shower/wash with your normal soap after using and rinsing off the CHG Soap.  9. Pat yourself dry with a CLEAN TOWEL.  10. Wear CLEAN PAJAMAS to bed the night before surgery, wear comfortable clothes the morning of surgery  11. Place CLEAN SHEETS on your bed the night of your first shower and DO NOT SLEEP WITH PETS.    Day of Surgery:  Do not apply any deodorants/lotions.  Please wear clean clothes to the hospital/surgery center.   Remember to brush your teeth WITH YOUR REGULAR TOOTHPASTE.    Please read over the following fact sheets that you were given.

## 2020-05-06 ENCOUNTER — Encounter (HOSPITAL_COMMUNITY): Payer: Self-pay

## 2020-05-06 ENCOUNTER — Other Ambulatory Visit: Payer: Self-pay

## 2020-05-06 ENCOUNTER — Encounter (HOSPITAL_COMMUNITY)
Admission: RE | Admit: 2020-05-06 | Discharge: 2020-05-06 | Disposition: A | Discharge status: Home / Self Care | Payer: Medicare Other | Source: Ambulatory Visit | Attending: Neurological Surgery | Admitting: Neurological Surgery

## 2020-05-06 ENCOUNTER — Ambulatory Visit (HOSPITAL_COMMUNITY)
Admission: RE | Admit: 2020-05-06 | Discharge: 2020-05-06 | Disposition: A | Discharge status: Home / Self Care | Payer: Medicare Other | Source: Ambulatory Visit | Attending: Neurological Surgery | Admitting: Neurological Surgery

## 2020-05-06 ENCOUNTER — Other Ambulatory Visit (HOSPITAL_COMMUNITY)
Admission: RE | Admit: 2020-05-06 | Discharge: 2020-05-06 | Disposition: A | Discharge status: Home / Self Care | Payer: Medicare Other | Source: Ambulatory Visit | Attending: Neurological Surgery | Admitting: Neurological Surgery

## 2020-05-06 DIAGNOSIS — Z20822 Contact with and (suspected) exposure to covid-19: Secondary | ICD-10-CM | POA: Diagnosis not present

## 2020-05-06 DIAGNOSIS — M7130 Other bursal cyst, unspecified site: Secondary | ICD-10-CM | POA: Diagnosis not present

## 2020-05-06 DIAGNOSIS — Z01818 Encounter for other preprocedural examination: Secondary | ICD-10-CM | POA: Diagnosis not present

## 2020-05-06 HISTORY — DX: Family history of other specified conditions: Z84.89

## 2020-05-06 LAB — CBC WITH DIFFERENTIAL/PLATELET
Abs Immature Granulocytes: 0.03 10*3/uL (ref 0.00–0.07)
Basophils Absolute: 0 10*3/uL (ref 0.0–0.1)
Basophils Relative: 1 %
Eosinophils Absolute: 0.1 10*3/uL (ref 0.0–0.5)
Eosinophils Relative: 1 %
HCT: 43 % (ref 36.0–46.0)
Hemoglobin: 14.2 g/dL (ref 12.0–15.0)
Immature Granulocytes: 0 %
Lymphocytes Relative: 28 %
Lymphs Abs: 2.4 10*3/uL (ref 0.7–4.0)
MCH: 34.5 pg — ABNORMAL HIGH (ref 26.0–34.0)
MCHC: 33 g/dL (ref 30.0–36.0)
MCV: 104.4 fL — ABNORMAL HIGH (ref 80.0–100.0)
Monocytes Absolute: 0.5 10*3/uL (ref 0.1–1.0)
Monocytes Relative: 6 %
Neutro Abs: 5.6 10*3/uL (ref 1.7–7.7)
Neutrophils Relative %: 64 %
Platelets: 314 10*3/uL (ref 150–400)
RBC: 4.12 MIL/uL (ref 3.87–5.11)
RDW: 12.4 % (ref 11.5–15.5)
WBC: 8.7 10*3/uL (ref 4.0–10.5)
nRBC: 0 % (ref 0.0–0.2)

## 2020-05-06 LAB — BASIC METABOLIC PANEL
Anion gap: 10 (ref 5–15)
BUN: 11 mg/dL (ref 8–23)
CO2: 25 mmol/L (ref 22–32)
Calcium: 10.5 mg/dL — ABNORMAL HIGH (ref 8.9–10.3)
Chloride: 104 mmol/L (ref 98–111)
Creatinine, Ser: 0.87 mg/dL (ref 0.44–1.00)
GFR calc Af Amer: >60 mL/min (ref >60)
GFR calc non Af Amer: >60 mL/min (ref >60)
Glucose, Bld: 110 mg/dL — ABNORMAL HIGH (ref 70–99)
Potassium: 3.8 mmol/L (ref 3.5–5.1)
Sodium: 139 mmol/L (ref 135–145)

## 2020-05-06 LAB — SURGICAL PCR SCREEN
MRSA, PCR: NEGATIVE
Staphylococcus aureus: NEGATIVE

## 2020-05-06 LAB — PROTIME-INR
INR: 1 (ref 0.8–1.2)
Prothrombin Time: 12.7 seconds (ref 11.4–15.2)

## 2020-05-06 LAB — SARS CORONAVIRUS 2 (TAT 6-24 HRS): SARS Coronavirus 2: NEGATIVE

## 2020-05-06 NOTE — Progress Notes (Signed)
PCP - elaine griffin Cardiologist - Nevin Bloodgood Ross-sees every 2 yrs.   Chest x-ray - 05/06/20 EKG - 05/06/20 Stress Test - na ECHO - na Cardiac Cath - na  Sleep Study - na   Fasting Blood Sugar - na   Blood Thinner Instructions:  na Aspirin Instructions: last dose 05/01/20    COVID TEST- 05/09/20   Anesthesia review:   Patient denies shortness of breath, fever, cough and chest pain at PAT appointment   All instructions explained to the patient, with a verbal understanding of the material. Patient agrees to go over the instructions while at home for a better understanding. Patient also instructed to self quarantine after being tested for COVID-19. The opportunity to ask questions was provided.  Margaretha Sheffield griffin

## 2020-05-09 ENCOUNTER — Ambulatory Visit (HOSPITAL_COMMUNITY): Payer: Medicare Other | Admitting: Anesthesiology

## 2020-05-09 ENCOUNTER — Ambulatory Visit (HOSPITAL_COMMUNITY)
Admission: RE | Admit: 2020-05-09 | Discharge: 2020-05-09 | Disposition: A | Discharge status: Home / Self Care | Payer: Medicare Other | Attending: Neurological Surgery | Admitting: Neurological Surgery

## 2020-05-09 ENCOUNTER — Other Ambulatory Visit: Payer: Self-pay

## 2020-05-09 ENCOUNTER — Encounter (HOSPITAL_COMMUNITY)
Admission: RE | Disposition: A | Discharge status: Home / Self Care | Payer: Self-pay | Source: Home / Self Care | Attending: Neurological Surgery

## 2020-05-09 ENCOUNTER — Encounter (HOSPITAL_COMMUNITY): Payer: Self-pay | Admitting: Neurological Surgery

## 2020-05-09 ENCOUNTER — Ambulatory Visit (HOSPITAL_COMMUNITY): Payer: Medicare Other

## 2020-05-09 ENCOUNTER — Ambulatory Visit (HOSPITAL_COMMUNITY): Payer: Medicare Other | Admitting: Physician Assistant

## 2020-05-09 DIAGNOSIS — F17210 Nicotine dependence, cigarettes, uncomplicated: Secondary | ICD-10-CM | POA: Diagnosis not present

## 2020-05-09 DIAGNOSIS — I10 Essential (primary) hypertension: Secondary | ICD-10-CM | POA: Diagnosis not present

## 2020-05-09 DIAGNOSIS — M7138 Other bursal cyst, other site: Secondary | ICD-10-CM | POA: Diagnosis not present

## 2020-05-09 DIAGNOSIS — M5127 Other intervertebral disc displacement, lumbosacral region: Secondary | ICD-10-CM | POA: Insufficient documentation

## 2020-05-09 DIAGNOSIS — Z7982 Long term (current) use of aspirin: Secondary | ICD-10-CM | POA: Insufficient documentation

## 2020-05-09 DIAGNOSIS — Z8249 Family history of ischemic heart disease and other diseases of the circulatory system: Secondary | ICD-10-CM | POA: Insufficient documentation

## 2020-05-09 DIAGNOSIS — Z791 Long term (current) use of non-steroidal anti-inflammatories (NSAID): Secondary | ICD-10-CM | POA: Diagnosis not present

## 2020-05-09 DIAGNOSIS — Z419 Encounter for procedure for purposes other than remedying health state, unspecified: Secondary | ICD-10-CM

## 2020-05-09 DIAGNOSIS — E7800 Pure hypercholesterolemia, unspecified: Secondary | ICD-10-CM | POA: Diagnosis not present

## 2020-05-09 DIAGNOSIS — Z79899 Other long term (current) drug therapy: Secondary | ICD-10-CM | POA: Insufficient documentation

## 2020-05-09 HISTORY — PX: LUMBAR LAMINECTOMY/DECOMPRESSION MICRODISCECTOMY: SHX5026

## 2020-05-09 SURGERY — LUMBAR LAMINECTOMY/DECOMPRESSION MICRODISCECTOMY 1 LEVEL
Anesthesia: General | Site: Spine Lumbar | Laterality: Left

## 2020-05-09 MED ORDER — DEXAMETHASONE 4 MG PO TABS: 4.0000 mg | ORAL_TABLET | Freq: Four times a day (QID) | ORAL | Status: DC

## 2020-05-09 MED ORDER — METHOCARBAMOL 500 MG PO TABS: 500.0000 mg | ORAL_TABLET | Freq: Four times a day (QID) | ORAL | Status: DC | PRN

## 2020-05-09 MED ORDER — HYDROCODONE-ACETAMINOPHEN 7.5-325 MG PO TABS: 1.0000 | ORAL_TABLET | Freq: Four times a day (QID) | ORAL | Status: DC

## 2020-05-09 MED ORDER — ONDANSETRON HCL 4 MG PO TABS: 4.0000 mg | ORAL_TABLET | Freq: Four times a day (QID) | ORAL | Status: DC | PRN

## 2020-05-09 MED ORDER — MENTHOL 3 MG MT LOZG: 1.0000 | LOZENGE | OROMUCOSAL | Status: DC | PRN

## 2020-05-09 MED ORDER — ORAL CARE MOUTH RINSE: 15.0000 mL | Freq: Once | OROMUCOSAL | Status: AC

## 2020-05-09 MED ORDER — METHOCARBAMOL 500 MG PO TABS
ORAL_TABLET | ORAL | Status: AC
  Administered 2020-05-09: 500 mg via ORAL
  Filled 2020-05-09: qty 1

## 2020-05-09 MED ORDER — ACETAMINOPHEN 650 MG RE SUPP: 650.0000 mg | RECTAL | Status: DC | PRN

## 2020-05-09 MED ORDER — PROPOFOL 10 MG/ML IV BOLUS
INTRAVENOUS | Status: DC | PRN
  Administered 2020-05-09: 100 mg via INTRAVENOUS

## 2020-05-09 MED ORDER — PHENOL 1.4 % MT LIQD: 1.0000 | OROMUCOSAL | Status: DC | PRN

## 2020-05-09 MED ORDER — CEFAZOLIN SODIUM-DEXTROSE 2-4 GM/100ML-% IV SOLN
2.0000 g | INTRAVENOUS | Status: AC
  Administered 2020-05-09: 2 g via INTRAVENOUS
  Filled 2020-05-09: qty 100

## 2020-05-09 MED ORDER — MIDAZOLAM HCL 2 MG/2ML IJ SOLN
INTRAMUSCULAR | Status: AC
  Filled 2020-05-09: qty 2

## 2020-05-09 MED ORDER — ACETAMINOPHEN 325 MG PO TABS: 650.0000 mg | ORAL_TABLET | ORAL | Status: DC | PRN

## 2020-05-09 MED ORDER — FENTANYL CITRATE (PF) 250 MCG/5ML IJ SOLN
INTRAMUSCULAR | Status: DC | PRN
  Administered 2020-05-09: 100 ug via INTRAVENOUS

## 2020-05-09 MED ORDER — CEFAZOLIN SODIUM-DEXTROSE 2-4 GM/100ML-% IV SOLN: 2.0000 g | Freq: Three times a day (TID) | INTRAVENOUS | Status: DC

## 2020-05-09 MED ORDER — THROMBIN 5000 UNITS EX SOLR
CUTANEOUS | Status: AC
  Filled 2020-05-09: qty 10000

## 2020-05-09 MED ORDER — THROMBIN 5000 UNITS EX SOLR: OROMUCOSAL | Status: DC | PRN

## 2020-05-09 MED ORDER — SODIUM CHLORIDE 0.9% FLUSH: 3.0000 mL | Freq: Two times a day (BID) | INTRAVENOUS | Status: DC

## 2020-05-09 MED ORDER — DEXAMETHASONE SODIUM PHOSPHATE 10 MG/ML IJ SOLN
10.0000 mg | Freq: Once | INTRAMUSCULAR | Status: AC
  Administered 2020-05-09: 10 mg via INTRAVENOUS
  Filled 2020-05-09: qty 1

## 2020-05-09 MED ORDER — PROMETHAZINE HCL 25 MG/ML IJ SOLN: 6.2500 mg | INTRAMUSCULAR | Status: DC | PRN

## 2020-05-09 MED ORDER — DEXAMETHASONE SODIUM PHOSPHATE 4 MG/ML IJ SOLN: 4.0000 mg | Freq: Four times a day (QID) | INTRAMUSCULAR | Status: DC

## 2020-05-09 MED ORDER — FENTANYL CITRATE (PF) 100 MCG/2ML IJ SOLN
INTRAMUSCULAR | Status: AC
Start: 2020-05-09 — End: 2020-05-09
  Administered 2020-05-09: 50 ug via INTRAVENOUS
  Filled 2020-05-09: qty 2

## 2020-05-09 MED ORDER — THROMBIN 5000 UNITS EX SOLR
CUTANEOUS | Status: DC | PRN
  Administered 2020-05-09 (×2): 5000 [IU] via TOPICAL

## 2020-05-09 MED ORDER — METHOCARBAMOL 1000 MG/10ML IJ SOLN: 500.0000 mg | Freq: Four times a day (QID) | INTRAVENOUS | Status: DC | PRN

## 2020-05-09 MED ORDER — SODIUM CHLORIDE 0.9% FLUSH: 3.0000 mL | INTRAVENOUS | Status: DC | PRN

## 2020-05-09 MED ORDER — FENTANYL CITRATE (PF) 100 MCG/2ML IJ SOLN
25.0000 ug | INTRAMUSCULAR | Status: DC | PRN
  Administered 2020-05-09: 50 ug via INTRAVENOUS

## 2020-05-09 MED ORDER — LIDOCAINE HCL (CARDIAC) PF 100 MG/5ML IV SOSY
PREFILLED_SYRINGE | INTRAVENOUS | Status: DC | PRN
  Administered 2020-05-09: 50 mg via INTRATRACHEAL
  Administered 2020-05-09: 100 mg via INTRATRACHEAL

## 2020-05-09 MED ORDER — BUPIVACAINE HCL (PF) 0.25 % IJ SOLN
INTRAMUSCULAR | Status: AC
  Filled 2020-05-09: qty 30

## 2020-05-09 MED ORDER — CHLORHEXIDINE GLUCONATE 0.12 % MT SOLN: 15.0000 mL | Freq: Once | OROMUCOSAL | Status: AC

## 2020-05-09 MED ORDER — CHLORHEXIDINE GLUCONATE CLOTH 2 % EX PADS: 6.0000 | MEDICATED_PAD | Freq: Once | CUTANEOUS | Status: DC

## 2020-05-09 MED ORDER — SUGAMMADEX SODIUM 200 MG/2ML IV SOLN
INTRAVENOUS | Status: DC | PRN
  Administered 2020-05-09: 200 mg via INTRAVENOUS

## 2020-05-09 MED ORDER — MIDAZOLAM HCL 5 MG/5ML IJ SOLN
INTRAMUSCULAR | Status: DC | PRN
  Administered 2020-05-09: 2 mg via INTRAVENOUS

## 2020-05-09 MED ORDER — LISINOPRIL-HYDROCHLOROTHIAZIDE 10-12.5 MG PO TABS: 1.0000 | ORAL_TABLET | Freq: Every day | ORAL | Status: DC

## 2020-05-09 MED ORDER — POTASSIUM CHLORIDE IN NACL 20-0.9 MEQ/L-% IV SOLN: INTRAVENOUS | Status: DC

## 2020-05-09 MED ORDER — ONDANSETRON HCL 4 MG/2ML IJ SOLN
INTRAMUSCULAR | Status: DC | PRN
  Administered 2020-05-09: 4 mg via INTRAVENOUS

## 2020-05-09 MED ORDER — ACETAMINOPHEN 10 MG/ML IV SOLN
INTRAVENOUS | Status: DC | PRN
  Administered 2020-05-09: 1000 mg via INTRAVENOUS

## 2020-05-09 MED ORDER — HYDROCODONE-ACETAMINOPHEN 5-325 MG PO TABS
1.0000 | ORAL_TABLET | ORAL | 0 refills | Status: AC | PRN
Start: 2020-05-09 — End: 2021-05-09

## 2020-05-09 MED ORDER — SENNA 8.6 MG PO TABS: 1.0000 | ORAL_TABLET | Freq: Two times a day (BID) | ORAL | Status: DC

## 2020-05-09 MED ORDER — FENTANYL CITRATE (PF) 250 MCG/5ML IJ SOLN
INTRAMUSCULAR | Status: AC
  Filled 2020-05-09: qty 5

## 2020-05-09 MED ORDER — MEPERIDINE HCL 25 MG/ML IJ SOLN: 6.2500 mg | INTRAMUSCULAR | Status: DC | PRN

## 2020-05-09 MED ORDER — SODIUM CHLORIDE 0.9 % IV SOLN: INTRAVENOUS | Status: DC | PRN

## 2020-05-09 MED ORDER — CHLORHEXIDINE GLUCONATE 0.12 % MT SOLN
OROMUCOSAL | Status: AC
  Administered 2020-05-09: 15 mL via OROMUCOSAL
  Filled 2020-05-09: qty 15

## 2020-05-09 MED ORDER — SODIUM CHLORIDE 0.9 % IV SOLN: 250.0000 mL | INTRAVENOUS | Status: DC

## 2020-05-09 MED ORDER — THROMBIN 5000 UNITS EX SOLR
CUTANEOUS | Status: AC
  Filled 2020-05-09: qty 5000

## 2020-05-09 MED ORDER — MORPHINE SULFATE (PF) 2 MG/ML IV SOLN: 2.0000 mg | INTRAVENOUS | Status: DC | PRN

## 2020-05-09 MED ORDER — LACTATED RINGERS IV SOLN: INTRAVENOUS | Status: DC

## 2020-05-09 MED ORDER — HEMOSTATIC AGENTS (NO CHARGE) OPTIME
TOPICAL | Status: DC | PRN
  Administered 2020-05-09: 1 via TOPICAL

## 2020-05-09 MED ORDER — ONDANSETRON HCL 4 MG/2ML IJ SOLN: 4.0000 mg | Freq: Four times a day (QID) | INTRAMUSCULAR | Status: DC | PRN

## 2020-05-09 MED ORDER — PHENYLEPHRINE HCL-NACL 10-0.9 MG/250ML-% IV SOLN
INTRAVENOUS | Status: DC | PRN
  Administered 2020-05-09: 20 ug/min via INTRAVENOUS

## 2020-05-09 MED ORDER — CELECOXIB 200 MG PO CAPS: 200.0000 mg | ORAL_CAPSULE | Freq: Two times a day (BID) | ORAL | Status: DC

## 2020-05-09 MED ORDER — BUPIVACAINE HCL (PF) 0.25 % IJ SOLN
INTRAMUSCULAR | Status: DC | PRN
  Administered 2020-05-09: 5 mL
  Administered 2020-05-09: 4 mL

## 2020-05-09 MED ORDER — ROCURONIUM BROMIDE 100 MG/10ML IV SOLN
INTRAVENOUS | Status: DC | PRN
  Administered 2020-05-09: 20 mg via INTRAVENOUS
  Administered 2020-05-09: 40 mg via INTRAVENOUS

## 2020-05-09 MED ORDER — ACETAMINOPHEN 10 MG/ML IV SOLN
INTRAVENOUS | Status: AC
  Filled 2020-05-09: qty 100

## 2020-05-09 MED ORDER — PROPOFOL 10 MG/ML IV BOLUS
INTRAVENOUS | Status: AC
  Filled 2020-05-09: qty 20

## 2020-05-09 SURGICAL SUPPLY — 44 items
APL SKNCLS STERI-STRIP NONHPOA (GAUZE/BANDAGES/DRESSINGS) ×1
BAG DECANTER FOR FLEXI CONT (MISCELLANEOUS) ×2 IMPLANT
BAND INSRT 18 STRL LF DISP RB (MISCELLANEOUS) ×2
BAND RUBBER #18 3X1/16 STRL (MISCELLANEOUS) ×4 IMPLANT
BENZOIN TINCTURE PRP APPL 2/3 (GAUZE/BANDAGES/DRESSINGS) ×2 IMPLANT
BUR CARBIDE MATCH 3.0 (BURR) ×2 IMPLANT
CANISTER SUCT 3000ML PPV (MISCELLANEOUS) ×2 IMPLANT
COVER WAND RF STERILE (DRAPES) ×2 IMPLANT
DIFFUSER DRILL AIR PNEUMATIC (MISCELLANEOUS) ×2 IMPLANT
DRAPE LAPAROTOMY 100X72X124 (DRAPES) ×2 IMPLANT
DRAPE MICROSCOPE LEICA (MISCELLANEOUS) ×2 IMPLANT
DRAPE SURG 17X23 STRL (DRAPES) ×2 IMPLANT
DRSG OPSITE POSTOP 3X4 (GAUZE/BANDAGES/DRESSINGS) ×1 IMPLANT
DURAPREP 26ML APPLICATOR (WOUND CARE) ×2 IMPLANT
ELECT REM PT RETURN 9FT ADLT (ELECTROSURGICAL) ×2
ELECTRODE REM PT RTRN 9FT ADLT (ELECTROSURGICAL) ×1 IMPLANT
GAUZE 4X4 16PLY RFD (DISPOSABLE) IMPLANT
GLOVE BIO SURGEON STRL SZ7 (GLOVE) IMPLANT
GLOVE BIO SURGEON STRL SZ8 (GLOVE) ×2 IMPLANT
GLOVE BIOGEL PI IND STRL 7.0 (GLOVE) IMPLANT
GLOVE BIOGEL PI INDICATOR 7.0 (GLOVE)
GOWN STRL REUS W/ TWL LRG LVL3 (GOWN DISPOSABLE) IMPLANT
GOWN STRL REUS W/ TWL XL LVL3 (GOWN DISPOSABLE) ×1 IMPLANT
GOWN STRL REUS W/TWL 2XL LVL3 (GOWN DISPOSABLE) IMPLANT
GOWN STRL REUS W/TWL LRG LVL3 (GOWN DISPOSABLE)
GOWN STRL REUS W/TWL XL LVL3 (GOWN DISPOSABLE) ×2
KIT BASIN OR (CUSTOM PROCEDURE TRAY) ×2 IMPLANT
KIT TURNOVER KIT B (KITS) ×2 IMPLANT
NDL HYPO 25X1 1.5 SAFETY (NEEDLE) ×1 IMPLANT
NDL SPNL 20GX3.5 QUINCKE YW (NEEDLE) IMPLANT
NEEDLE HYPO 25X1 1.5 SAFETY (NEEDLE) ×2 IMPLANT
NEEDLE SPNL 20GX3.5 QUINCKE YW (NEEDLE) IMPLANT
NS IRRIG 1000ML POUR BTL (IV SOLUTION) ×2 IMPLANT
PACK LAMINECTOMY NEURO (CUSTOM PROCEDURE TRAY) ×2 IMPLANT
PAD ARMBOARD 7.5X6 YLW CONV (MISCELLANEOUS) ×6 IMPLANT
SPONGE SURGIFOAM ABS GEL SZ50 (HEMOSTASIS) IMPLANT
STRIP CLOSURE SKIN 1/2X4 (GAUZE/BANDAGES/DRESSINGS) ×2 IMPLANT
SUT VIC AB 0 CT1 18XCR BRD8 (SUTURE) ×1 IMPLANT
SUT VIC AB 0 CT1 8-18 (SUTURE) ×2
SUT VIC AB 2-0 CP2 18 (SUTURE) ×2 IMPLANT
SUT VIC AB 3-0 SH 8-18 (SUTURE) ×2 IMPLANT
TOWEL GREEN STERILE (TOWEL DISPOSABLE) ×2 IMPLANT
TOWEL GREEN STERILE FF (TOWEL DISPOSABLE) ×2 IMPLANT
WATER STERILE IRR 1000ML POUR (IV SOLUTION) ×2 IMPLANT

## 2020-05-09 NOTE — Anesthesia Postprocedure Evaluation (Signed)
Anesthesia Post Note  Patient: Maria Rich  Procedure(s) Performed: Laminectomy for facet/synovial cyst - Lumbar Five-Sacral One - Left (Left Spine Lumbar)     Patient location during evaluation: PACU Anesthesia Type: General Level of consciousness: sedated and patient cooperative Pain management: pain level controlled Vital Signs Assessment: post-procedure vital signs reviewed and stable Respiratory status: spontaneous breathing Cardiovascular status: stable Anesthetic complications: no   No complications documented.  Last Vitals:  Vitals:   05/09/20 1105 05/09/20 1120  BP: (!) 157/68 (!) 148/62  Pulse: (!) 55 61  Resp: 14 15  Temp:  37 C  SpO2: 98% 97%    Last Pain:  Vitals:   05/09/20 1120  TempSrc:   PainSc: 2                  Nolon Nations

## 2020-05-09 NOTE — Transfer of Care (Signed)
Immediate Anesthesia Transfer of Care Note  Patient: Maria Rich  Procedure(s) Performed: Laminectomy for facet/synovial cyst - Lumbar Five-Sacral One - Left (Left Spine Lumbar)  Patient Location: PACU  Anesthesia Type:General  Level of Consciousness: awake, alert  and oriented  Airway & Oxygen Therapy: Patient Spontanous Breathing  Post-op Assessment: Report given to RN and Post -op Vital signs reviewed and stable  Post vital signs: Reviewed and stable  Last Vitals:  Vitals Value Taken Time  BP    Temp    Pulse 66 05/09/20 1049  Resp    SpO2 100 % 05/09/20 1049  Vitals shown include unvalidated device data.  Last Pain:  Vitals:   05/09/20 0801  TempSrc:   PainSc: 4          Complications: No complications documented.

## 2020-05-09 NOTE — H&P (Signed)
Subjective: Patient is a 69 y.o. female admitted for L leg pain. Onset of symptoms was several months ago, rapidly worsening since that time.  The pain is rated intense, unremitting, and is located at the left gluteal area and radiates to LLE. The pain is described as aching and occurs all day. The symptoms have been progressive. Symptoms are exacerbated by exercise. MRI or CT showed L L5-S1 synovial cyst   Past Medical History:  Diagnosis Date  . Atypical nevus 11/27/2001   slight-moderate-left low back (WS)  . Atypical nevus 09/23/2004   moderate-left trapezius   . Atypical nevus 09/23/2004   moderate-center upper back   . Atypical nevus 07/26/2005   moderate-right midback  . Atypical nevus 06/13/2008   moderate-left upper arm (WS)  . Atypical nevus 03/30/2011   mild-right inner thigh  . Atypical nevus 07/17/2013   moderate-left inner knee  . Atypical nevus 12/10/2014   mild-left scapula  . Atypical nevus 02/05/2016   mild-left upper paraspinal  . Family history of adverse reaction to anesthesia    mother had a topical anesthesia , skin irritation, when colonoscopy didn't use any anesthesia  . Hypertension     Past Surgical History:  Procedure Laterality Date  . EYE SURGERY Bilateral    cataract implant surgery,right eye  . TOTAL ABDOMINAL HYSTERECTOMY W/ BILATERAL SALPINGOOPHORECTOMY      Prior to Admission medications   Medication Sig Start Date End Date Taking? Authorizing Provider  Ascorbic Acid (VITAMIN C PO) Take 1 tablet by mouth daily with lunch.   Yes [provider]  aspirin 81 MG chewable tablet Chew 81 mg by mouth daily with lunch.   Yes [provider]  Calcium Carb-Cholecalciferol (CALCIUM + D3 PO) Take 1 tablet by mouth daily with lunch.   Yes [provider]  Glucosamine HCl (GLUCOSAMINE PO) Take 1 tablet by mouth daily with lunch.   Yes [provider]  lisinopril-hydrochlorothiazide (PRINZIDE,ZESTORETIC) 10-12.5 MG  tablet TAKE ONE TABLET BY MOUTH ONCE DAILY Patient taking differently: Take 1 tablet by mouth daily.  10/13/15  Yes Fay Records, MD  meloxicam (MOBIC) 15 MG tablet Take 15 mg by mouth daily.  03/20/20  Yes [provider]  Omega-3 Fatty Acids (FISH OIL PO) Take 1 capsule by mouth in the morning and at bedtime.   Yes [provider]  simvastatin (ZOCOR) 20 MG tablet TAKE ONE TABLET BY MOUTH AT BEDTIME Patient taking differently: Take 10 mg by mouth every evening.  04/07/16  Yes Fay Records, MD  traMADol (ULTRAM) 50 MG tablet Take 50 mg by mouth 3 (three) times daily as needed (pain.).  04/17/20  Yes [provider]  ALPRAZolam Duanne Moron) 0.5 MG tablet Take 0.25 mg by mouth at bedtime as needed for sleep.  12/04/19   [provider]   No Known Allergies  Social History   Tobacco Use  . Smoking status: Current Every Day Smoker    Packs/day: 0.50    Years: 40.00    Pack years: 20.00    Types: Cigarettes  . Smokeless tobacco: Never Used  . Tobacco comment: trying to quit and has been 3 weeks  Substance Use Topics  . Alcohol use: Yes    Comment: beer on weekends    Family History  Problem Relation Age of Onset  . Heart attack Father   . Heart disease Sister   . Heart disease Brother      Review of Systems  Positive ROS: neg  All other systems have been reviewed and were otherwise negative with the exception of those mentioned in the HPI and as above.  Objective: Vital signs in last 24 hours: Temp:  [98.3 F (36.8 C)] 98.3 F (36.8 C) (08/27 0757) Pulse Rate:  [72] 72 (08/27 0757) Resp:  [18] 18 (08/27 0757) BP: (139)/(59) 139/59 (08/27 0757) SpO2:  [97 %] 97 % (08/27 0757) Weight:  [54 kg] 54 kg (08/27 0801)  General Appearance: Alert, cooperative, no distress, appears stated age Head: Normocephalic, without obvious abnormality, atraumatic Eyes: PERRL, conjunctiva/corneas clear, EOM's intact    Neck: Supple, symmetrical, trachea  midline Back: Symmetric, no curvature, ROM normal, no CVA tenderness Lungs:  respirations unlabored Heart: Regular rate and rhythm Abdomen: Soft, non-tender Extremities: Extremities normal, atraumatic, no cyanosis or edema Pulses: 2+ and symmetric all extremities Skin: Skin color, texture, turgor normal, no rashes or lesions  NEUROLOGIC:   Mental status: Alert and oriented x4,  no aphasia, good attention span, fund of knowledge, and memory Motor Exam - grossly normal Sensory Exam - grossly normal Reflexes: 1+ Coordination - grossly normal Gait - grossly normal Balance - grossly normal Cranial Nerves: I: smell Not tested  II: visual acuity  OS: nl    OD: nl  II: visual fields Full to confrontation  II: pupils Equal, round, reactive to light  III,VII: ptosis None  III,IV,VI: extraocular muscles  Full ROM  V: mastication Normal  V: facial light touch sensation  Normal  V,VII: corneal reflex  Present  VII: facial muscle function - upper  Normal  VII: facial muscle function - lower Normal  VIII: hearing Not tested  IX: soft palate elevation  Normal  IX,X: gag reflex Present  XI: trapezius strength  5/5  XI: sternocleidomastoid strength 5/5  XI: neck flexion strength  5/5  XII: tongue strength  Normal    Data Review Lab Results  Component Value Date   WBC 8.7 05/06/2020   HGB 14.2 05/06/2020   HCT 43.0 05/06/2020   MCV 104.4 (H) 05/06/2020   PLT 314 05/06/2020   Lab Results  Component Value Date   NA 139 05/06/2020   K 3.8 05/06/2020   CL 104 05/06/2020   CO2 25 05/06/2020   BUN 11 05/06/2020   CREATININE 0.87 05/06/2020   GLUCOSE 110 (H) 05/06/2020   Lab Results  Component Value Date   INR 1.0 05/06/2020    Assessment/Plan:  Estimated body mass index is 21.77 kg/m as calculated from the following:   Height as of this encounter: 5\' 2"  (1.575 m).   Weight as of this encounter: 54 kg. Patient admitted for L L5-S1 lami for synovial cyst. Patient has failed  a reasonable attempt at conservative therapy.  I explained the condition and procedure to the patient and answered any questions.  Patient wishes to proceed with procedure as planned. Understands risks/ benefits and typical outcomes of procedure.   Eustace Moore 05/09/2020 9:24 AM

## 2020-05-09 NOTE — Anesthesia Preprocedure Evaluation (Addendum)
Anesthesia Evaluation  Patient identified by MRN, date of birth, ID band Patient awake    Reviewed: Allergy & Precautions, NPO status , Patient's Chart, lab work & pertinent test results  Airway Mallampati: II  TM Distance: >3 FB Neck ROM: Full    Dental no notable dental hx. (+) Dental Advisory Given, Teeth Intact   Pulmonary Current Smoker,    Pulmonary exam normal breath sounds clear to auscultation       Cardiovascular hypertension, Pt. on medications Normal cardiovascular exam Rhythm:Regular Rate:Normal     Neuro/Psych negative neurological ROS     GI/Hepatic negative GI ROS, Neg liver ROS,   Endo/Other  negative endocrine ROS  Renal/GU negative Renal ROS     Musculoskeletal negative musculoskeletal ROS (+)   Abdominal   Peds  Hematology negative hematology ROS (+)   Anesthesia Other Findings   Reproductive/Obstetrics                            Anesthesia Physical Anesthesia Plan  ASA: II  Anesthesia Plan: General   Post-op Pain Management:    Induction: Intravenous  PONV Risk Score and Plan: 3 and Ondansetron  Airway Management Planned: Oral ETT  Additional Equipment: None  Intra-op Plan:   Post-operative Plan: Extubation in OR  Informed Consent: I have reviewed the patients History and Physical, chart, labs and discussed the procedure including the risks, benefits and alternatives for the proposed anesthesia with the patient or authorized representative who has indicated his/her understanding and acceptance.     Dental advisory given  Plan Discussed with: CRNA  Anesthesia Plan Comments:        Anesthesia Quick Evaluation

## 2020-05-09 NOTE — Op Note (Signed)
05/09/2020  10:43 AM  PATIENT:  Maria Rich  69 y.o. female  PRE-OPERATIVE DIAGNOSIS: Left L5-S1 synovial cyst, left L4 S1 herniated nucleus pulposus, left leg pain  POST-OPERATIVE DIAGNOSIS:  same  PROCEDURE: Left L5-S1 decompressive hemilaminectomy medial facetectomy foraminotomy followed by microdiscectomy utilizing microscopic dissection  SURGEON:  Sherley Bounds, MD  ASSISTANTS: Glenford Peers, FNP  ANESTHESIA:   General  EBL: Less than 25 ml  Total I/O In: 200 [IV Piggyback:200] Out: -   BLOOD ADMINISTERED: none  DRAINS: None  SPECIMEN:  none  INDICATION FOR PROCEDURE: This patient presented with severe left leg pain down the back of the leg. Imaging showed synovial cyst on the left at L5-S1 with a small adjacent disc herniation compressing the left S1 nerve root. The patient tried conservative measures without relief. Pain was debilitating. Recommended left L5-S1 decompression and discectomy. Patient understood the risks, benefits, and alternatives and potential outcomes and wished to proceed.  PROCEDURE DETAILS: The patient was taken to the operating room and after induction of adequate generalized endotracheal anesthesia, the patient was rolled into the prone position on the Wilson frame and all pressure points were padded. The lumbar region was cleaned and then prepped with DuraPrep and draped in the usual sterile fashion. 5 cc of local anesthesia was injected and then a dorsal midline incision was made and carried down to the lumbo sacral fascia. The fascia was opened and the paraspinous musculature was taken down in a subperiosteal fashion to expose L5-S1 on the left. Intraoperative x-ray confirmed my level, and then I used a combination of the high-speed drill and the Kerrison punches to perform a hemilaminectomy, medial facetectomy, and foraminotomy at L5-S1 on the left. The underlying yellow ligament was opened and removed in a piecemeal fashion to expose the  underlying dura and exiting nerve root. I undercut the lateral recess and dissected down until I was medial to and distal to the pedicle. The nerve root was well decompressed. We then gently retracted the nerve root medially with a retractor, coagulated the epidural venous vasculature, and incised the disc space. ! performed a thorough intradiscal discectomy with pituitary rongeurs and curettes, until I had a nice decompression of the nerve root and the midline. I then palpated with a coronary dilator along the nerve root and into the foramen to assure adequate decompression. I felt no more compression of the nerve root. I irrigated with saline solution containing bacitracin. Achieved hemostasis with bipolar cautery, lined the dura with Gelfoam, and then closed the fascia with 0 Vicryl. I closed the subcutaneous tissues with 2-0 Vicryl and the subcuticular tissues with 3-0 Vicryl. The skin was then closed with benzoin and Steri-Strips. The drapes were removed, a sterile dressing was applied.  My nurse practitioner was involved in the exposure, safe retraction of the neural elements, the disc work and the closure. the patient was awakened from general anesthesia and transferred to the recovery room in stable condition. At the end of the procedure all sponge, needle and instrument counts were correct.    PLAN OF CARE: Discharge to home after PACU  PATIENT DISPOSITION:  PACU - hemodynamically stable.   Delay start of Pharmacological VTE agent (>24hrs) due to surgical blood loss or risk of bleeding:  yes

## 2020-05-09 NOTE — Anesthesia Procedure Notes (Signed)

## 2020-05-10 ENCOUNTER — Encounter (HOSPITAL_COMMUNITY): Payer: Self-pay | Admitting: Neurological Surgery

## 2020-05-21 ENCOUNTER — Other Ambulatory Visit: Payer: Self-pay

## 2020-05-21 ENCOUNTER — Other Ambulatory Visit (HOSPITAL_COMMUNITY): Payer: Self-pay | Admitting: Student

## 2020-05-21 ENCOUNTER — Ambulatory Visit (HOSPITAL_COMMUNITY)
Admission: RE | Admit: 2020-05-21 | Discharge: 2020-05-21 | Disposition: A | Discharge status: Home / Self Care | Payer: Medicare Other | Source: Ambulatory Visit | Attending: Neurological Surgery | Admitting: Neurological Surgery

## 2020-05-21 DIAGNOSIS — M79605 Pain in left leg: Secondary | ICD-10-CM

## 2020-06-06 DIAGNOSIS — Z23 Encounter for immunization: Secondary | ICD-10-CM | POA: Diagnosis not present

## 2020-06-19 DIAGNOSIS — M7062 Trochanteric bursitis, left hip: Secondary | ICD-10-CM | POA: Diagnosis not present

## 2020-06-23 DIAGNOSIS — Z23 Encounter for immunization: Secondary | ICD-10-CM | POA: Diagnosis not present

## 2020-07-02 ENCOUNTER — Telehealth: Payer: Self-pay | Admitting: Internal Medicine

## 2020-07-02 DIAGNOSIS — I679 Cerebrovascular disease, unspecified: Secondary | ICD-10-CM

## 2020-07-02 NOTE — Telephone Encounter (Signed)
Patient is requesting an order for a Carotid ultrasound. Please assist.

## 2020-07-02 NOTE — Telephone Encounter (Signed)
Last carotid US in 07/2018.  Has follow up with Dr. Harrington Challenger in December.  Per Dr. Harrington Challenger - order placed for carotid US to be done prior to her next appointment with Dr. Harrington Challenger.

## 2020-07-17 ENCOUNTER — Ambulatory Visit (HOSPITAL_COMMUNITY)
Admission: RE | Admit: 2020-07-17 | Discharge: 2020-07-17 | Disposition: A | Discharge status: Home / Self Care | Payer: Medicare Other | Source: Ambulatory Visit | Attending: Cardiology | Admitting: Cardiology

## 2020-07-17 ENCOUNTER — Other Ambulatory Visit: Payer: Self-pay

## 2020-07-17 DIAGNOSIS — I6789 Other cerebrovascular disease: Secondary | ICD-10-CM

## 2020-07-17 DIAGNOSIS — I679 Cerebrovascular disease, unspecified: Secondary | ICD-10-CM | POA: Diagnosis not present

## 2020-08-01 DIAGNOSIS — M1612 Unilateral primary osteoarthritis, left hip: Secondary | ICD-10-CM | POA: Diagnosis not present

## 2020-08-01 DIAGNOSIS — I10 Essential (primary) hypertension: Secondary | ICD-10-CM | POA: Diagnosis not present

## 2020-08-21 ENCOUNTER — Ambulatory Visit (INDEPENDENT_AMBULATORY_CARE_PROVIDER_SITE_OTHER): Payer: Medicare Other | Admitting: Internal Medicine

## 2020-08-21 ENCOUNTER — Encounter: Payer: Self-pay | Admitting: Internal Medicine

## 2020-08-21 ENCOUNTER — Other Ambulatory Visit: Payer: Self-pay

## 2020-08-21 VITALS — BP 130/88 | HR 75 | Ht 60.0 in | Wt 120.8 lb

## 2020-08-21 DIAGNOSIS — Z1321 Encounter for screening for nutritional disorder: Secondary | ICD-10-CM | POA: Diagnosis not present

## 2020-08-21 DIAGNOSIS — Z1329 Encounter for screening for other suspected endocrine disorder: Secondary | ICD-10-CM

## 2020-08-21 DIAGNOSIS — I10 Essential (primary) hypertension: Secondary | ICD-10-CM

## 2020-08-21 DIAGNOSIS — E7800 Pure hypercholesterolemia, unspecified: Secondary | ICD-10-CM | POA: Diagnosis not present

## 2020-08-21 DIAGNOSIS — E559 Vitamin D deficiency, unspecified: Secondary | ICD-10-CM | POA: Diagnosis not present

## 2020-08-21 DIAGNOSIS — R7309 Other abnormal glucose: Secondary | ICD-10-CM

## 2020-08-21 NOTE — Progress Notes (Signed)
Cardiology Office Note   Date:  08/21/2020   ID:  Maria Rich, DOB 1951-04-26, MRN [id number]  PCP:  Kelton Pillar, MD  Cardiologist:   Dorris Carnes, MD   F/U of CV dz and HTN     History of Present Illness: Maria Rich is a 69 y.o. female with a history of mild CV dz, HL, HTN   I saw her in 2019  Since seen she had a carotid USN in November 2021 what showed mild carotid plaquing     SInce I saw her she has done well  Had back surgery   Getting back into activity   Denies CP  No SOB  Walking   BP at home usually 120 to 140        Current Outpatient Medications  Medication Sig Dispense Refill  . acyclovir ointment (ZOVIRAX) 5 % 1 application    . ALPRAZolam (XANAX) 0.5 MG tablet Take 0.25 mg by mouth at bedtime as needed for sleep.     . Ascorbic Acid (VITAMIN C PO) Take 1 tablet by mouth daily with lunch.    . Calcium Carb-Cholecalciferol (CALCIUM + D3 PO) Take 1 tablet by mouth daily with lunch.    . Glucosamine HCl (GLUCOSAMINE PO) Take 1 tablet by mouth daily with lunch.    . lisinopril-hydrochlorothiazide (PRINZIDE,ZESTORETIC) 10-12.5 MG tablet TAKE ONE TABLET BY MOUTH ONCE DAILY 90 tablet 2  . meloxicam (MOBIC) 15 MG tablet Take 15 mg by mouth daily.     . Omega-3 Fatty Acids (FISH OIL PO) Take 1 capsule by mouth in the morning and at bedtime.    . simvastatin (ZOCOR) 20 MG tablet TAKE ONE TABLET BY MOUTH AT BEDTIME 90 tablet 1  . aspirin 81 MG chewable tablet Chew 81 mg by mouth daily with lunch.    Marland Kitchen HYDROcodone-acetaminophen (NORCO/VICODIN) 5-325 MG tablet Take 1 tablet by mouth every 4 (four) hours as needed for moderate pain. 30 tablet 0   No current facility-administered medications for this visit.    Allergies:   Patient has no known allergies.   Past Medical History:  Diagnosis Date  . Atypical nevus 11/27/2001   slight-moderate-left low back (WS)  . Atypical nevus 09/23/2004   moderate-left trapezius   . Atypical nevus 09/23/2004    moderate-center upper back   . Atypical nevus 07/26/2005   moderate-right midback  . Atypical nevus 06/13/2008   moderate-left upper arm (WS)  . Atypical nevus 03/30/2011   mild-right inner thigh  . Atypical nevus 07/17/2013   moderate-left inner knee  . Atypical nevus 12/10/2014   mild-left scapula  . Atypical nevus 02/05/2016   mild-left upper paraspinal  . Family history of adverse reaction to anesthesia    mother had a topical anesthesia , skin irritation, when colonoscopy didn't use any anesthesia  . Hypertension     Past Surgical History:  Procedure Laterality Date  . EYE SURGERY Bilateral    cataract implant surgery,right eye  . LUMBAR LAMINECTOMY/DECOMPRESSION MICRODISCECTOMY Left 05/09/2020   Procedure: Laminectomy for facet/synovial cyst - Lumbar Five-Sacral One - Left;  Surgeon: Eustace Moore, MD;  Location: [facility];  Service: Neurosurgery;  Laterality: Left;  . TOTAL ABDOMINAL HYSTERECTOMY W/ BILATERAL SALPINGOOPHORECTOMY       Social History:  The patient  reports that she has been smoking cigarettes. She has a 20.00 pack-year smoking history. She has never used smokeless tobacco. She reports current alcohol use. She reports that she does not use drugs.  Family History:  The patient's family history includes Heart attack in her father; Heart disease in her brother and sister.    ROS:  Please see the history of present illness. All other systems are reviewed and  Negative to the above problem except as noted.    PHYSICAL EXAM: VS:  BP 130/88   Pulse 75   Ht 5' (1.524 m)   Wt 120 lb 12.8 oz (54.8 kg)   SpO2 98%   BMI 23.59 kg/m   GEN:  Thin 69 yo  in no acute distress HEENT: normal Neck: no JVD, carotid bruits, Cardiac: RRR; no murmurs,  No LE edema  Respiratory:  clear to auscultation bilaterally, normal work of breathing GI: soft, nontender, nondistended, + BS  No hepatomegaly  MS: no deformity Moving all extremities   Skin: warm and dry, no rash Neuro:   Strength and sensation are intact Psych: euthymic mood, full affect   EKG:  EKG is not ordered today  In Aug 2021  SR 61  Anteroseptal MI    Unchanged from 2019   Lipid Panel    Component Value Date/Time   CHOL 139 08/28/2015 1447   TRIG 113 08/28/2015 1447   HDL 57 08/28/2015 1447   CHOLHDL 2.4 08/28/2015 1447   VLDL 23 08/28/2015 1447   LDLCALC 59 08/28/2015 1447      Wt Readings from Last 3 Encounters:  08/21/20 120 lb 12.8 oz (54.8 kg)  05/09/20 119 lb (54 kg)  05/06/20 119 lb (54 kg)      ASSESSMENT AND PLAN:  1  HTN  Diastolic BP is up this am  She says it is better  At home usually 120s to l0w 130s   She has appt with E Laurann Montana next week  Take cuff to make sure OK     2.  HL  Will get lipids   She was a little high last visit   Hx of CV dz   Need tight control    3.  CV dz  Plaquing remains mild        F/U in 1 year because of BP     Signed, Dorris Carnes, MD  08/21/2020 8:56 AM    [facility] [address], [place], [place]  [address] Phone: [phone]; Fax: [phone]

## 2020-08-21 NOTE — Patient Instructions (Signed)
Medication Instructions:  No changes *If you need a refill on your cardiac medications before your next appointment, please call your pharmacy*   Lab Work: Today: cbc, bmet, hgA1c, tsh, vit d, lipids  If you have labs (blood work) drawn today and your tests are completely normal, you will receive your results only by: Marland Kitchen MyChart Message (if you have MyChart) OR . A paper copy in the mail If you have any lab test that is abnormal or we need to change your treatment, we will call you to review the results.   Testing/Procedures: none   Follow-Up: At [facility], you and your health needs are our priority.  As part of our continuing mission to provide you with exceptional heart care, we have created designated Provider Care Teams.  These Care Teams include your primary Cardiologist (physician) and Advanced Practice Providers (APPs -  Physician Assistants and Nurse Practitioners) who all work together to provide you with the care you need, when you need it.   Your next appointment:   12 month(s)  The format for your next appointment:   In Person  Provider:   You may see Dorris Carnes, MD or one of the following Advanced Practice Providers on your designated Care Team:    Richardson Dopp, PA-C  Robbie Lis, [place]   Other Instructions

## 2020-08-22 LAB — LIPID PANEL
Chol/HDL Ratio: 2.2 ratio (ref 0.0–4.4)
Cholesterol, Total: 195 mg/dL (ref 100–199)
HDL: 87 mg/dL (ref >39)
LDL Chol Calc (NIH): 94 mg/dL (ref 0–99)
Triglycerides: 76 mg/dL (ref 0–149)
VLDL Cholesterol Cal: 14 mg/dL (ref 5–40)

## 2020-08-22 LAB — CBC
Hematocrit: 40.8 % (ref 34.0–46.6)
Hemoglobin: 13.8 g/dL (ref 11.1–15.9)
MCH: 33.5 pg — ABNORMAL HIGH (ref 26.6–33.0)
MCHC: 33.8 g/dL (ref 31.5–35.7)
MCV: 99 fL — ABNORMAL HIGH (ref 79–97)
Platelets: 346 10*3/uL (ref 150–450)
RBC: 4.12 x10E6/uL (ref 3.77–5.28)
RDW: 13.1 % (ref 11.7–15.4)
WBC: 9.1 10*3/uL (ref 3.4–10.8)

## 2020-08-22 LAB — VITAMIN D 25 HYDROXY (VIT D DEFICIENCY, FRACTURES): Vit D, 25-Hydroxy: 20.3 ng/mL — ABNORMAL LOW (ref 30.0–100.0)

## 2020-08-22 LAB — BASIC METABOLIC PANEL
BUN/Creatinine Ratio: 19 (ref 12–28)
BUN: 17 mg/dL (ref 8–27)
CO2: 21 mmol/L (ref 20–29)
Calcium: 10.8 mg/dL — ABNORMAL HIGH (ref 8.7–10.3)
Chloride: 99 mmol/L (ref 96–106)
Creatinine, Ser: 0.9 mg/dL (ref 0.57–1.00)
GFR calc Af Amer: 77 mL/min/{1.73_m2} (ref >59)
GFR calc non Af Amer: 66 mL/min/{1.73_m2} (ref >59)
Glucose: 99 mg/dL (ref 65–99)
Potassium: 4.4 mmol/L (ref 3.5–5.2)
Sodium: 136 mmol/L (ref 134–144)

## 2020-08-22 LAB — HEMOGLOBIN A1C
Est. average glucose Bld gHb Est-mCnc: 120 mg/dL
Hgb A1c MFr Bld: 5.8 % — ABNORMAL HIGH (ref 4.8–5.6)

## 2020-08-22 LAB — TSH: TSH: 2.08 u[IU]/mL (ref 0.450–4.500)

## 2020-08-26 DIAGNOSIS — Z0000 Encounter for general adult medical examination without abnormal findings: Secondary | ICD-10-CM | POA: Diagnosis not present

## 2020-08-26 DIAGNOSIS — I779 Disorder of arteries and arterioles, unspecified: Secondary | ICD-10-CM | POA: Diagnosis not present

## 2020-08-26 DIAGNOSIS — E7800 Pure hypercholesterolemia, unspecified: Secondary | ICD-10-CM | POA: Diagnosis not present

## 2020-08-26 DIAGNOSIS — I2510 Atherosclerotic heart disease of native coronary artery without angina pectoris: Secondary | ICD-10-CM | POA: Diagnosis not present

## 2020-08-26 DIAGNOSIS — N951 Menopausal and female climacteric states: Secondary | ICD-10-CM | POA: Diagnosis not present

## 2020-08-26 DIAGNOSIS — K635 Polyp of colon: Secondary | ICD-10-CM | POA: Diagnosis not present

## 2020-08-26 DIAGNOSIS — B009 Herpesviral infection, unspecified: Secondary | ICD-10-CM | POA: Diagnosis not present

## 2020-08-26 DIAGNOSIS — I119 Hypertensive heart disease without heart failure: Secondary | ICD-10-CM | POA: Diagnosis not present

## 2020-08-28 ENCOUNTER — Telehealth: Payer: Self-pay | Admitting: *Deleted

## 2020-08-28 DIAGNOSIS — E7800 Pure hypercholesterolemia, unspecified: Secondary | ICD-10-CM

## 2020-08-28 MED ORDER — ROSUVASTATIN CALCIUM 10 MG PO TABS: 10.0000 mg | ORAL_TABLET | Freq: Every day | ORAL | 3 refills | Status: DC

## 2020-08-28 NOTE — Telephone Encounter (Signed)
-----   Message from Fay Records, MD sent at 08/24/2020 10:26 PM EST ----- Thyroid function is normal  CBC is OK    Hgb A1C is minimally elevated   Keep watching carbs Lipids   LDL is 94   Goal is toward 70   Stop simvistatin   Try Crestor 10   F/U lipids and AST in 8 wks

## 2020-08-28 NOTE — Telephone Encounter (Signed)
Patient informed and will stop simvastatin and start Crestor 10 mg daily. Lab appointment made.

## 2020-09-18 ENCOUNTER — Encounter: Payer: Self-pay | Admitting: Physician Assistant

## 2020-09-18 ENCOUNTER — Other Ambulatory Visit: Payer: Self-pay

## 2020-09-18 ENCOUNTER — Ambulatory Visit (INDEPENDENT_AMBULATORY_CARE_PROVIDER_SITE_OTHER): Payer: Medicare Other | Admitting: Physician Assistant

## 2020-09-18 DIAGNOSIS — Z87898 Personal history of other specified conditions: Secondary | ICD-10-CM

## 2020-09-18 DIAGNOSIS — L821 Other seborrheic keratosis: Secondary | ICD-10-CM

## 2020-09-18 DIAGNOSIS — Z1283 Encounter for screening for malignant neoplasm of skin: Secondary | ICD-10-CM | POA: Diagnosis not present

## 2020-09-18 DIAGNOSIS — Z86018 Personal history of other benign neoplasm: Secondary | ICD-10-CM | POA: Diagnosis not present

## 2020-09-18 NOTE — Progress Notes (Signed)
   Follow-Up Visit   Subjective  Maria Rich is a 70 y.o. female who presents for the following: Annual Exam (No new concerns).   The following portions of the chart were reviewed this encounter and updated as appropriate:  Tobacco  Allergies  Meds  Problems  Med Hx  Surg Hx  Fam Hx      Objective  Well appearing patient in no apparent distress; mood and affect are within normal limits.  A full examination was performed including scalp, head, eyes, ears, nose, lips, neck, chest, axillae, abdomen, back, buttocks, bilateral upper extremities, bilateral lower extremities, hands, feet, fingers, toes, fingernails, and toenails. All findings within normal limits unless otherwise noted below.  Objective  Chest - Medial [facility]): Multiple white scar- clear  Objective  Chest - Medial [facility]): Full body skin examination- no atypical moles or non mole skin cancer  Objective  Left Lower Back: Multiple Stuck-on, waxy, tan-brown papules and plaques. --Discussed benign etiology and prognosis.    Assessment & Plan  History of atypical nevus Chest - Medial [facility])  Yearly skin check  Screening exam for skin cancer Chest - Medial [facility])  Yearly skin check  Seborrheic keratosis Left Lower Back  Okay to leave if stable    I, Jakarie Pember, PA-C, have reviewed all documentation's for this visit.  The documentation on 09/18/20 for the exam, diagnosis, procedures and orders are all accurate and complete.

## 2020-09-26 DIAGNOSIS — H40013 Open angle with borderline findings, low risk, bilateral: Secondary | ICD-10-CM | POA: Diagnosis not present

## 2020-09-26 DIAGNOSIS — H02834 Dermatochalasis of left upper eyelid: Secondary | ICD-10-CM | POA: Diagnosis not present

## 2020-09-26 DIAGNOSIS — H02831 Dermatochalasis of right upper eyelid: Secondary | ICD-10-CM | POA: Diagnosis not present

## 2020-09-26 DIAGNOSIS — Z961 Presence of intraocular lens: Secondary | ICD-10-CM | POA: Diagnosis not present

## 2020-09-26 DIAGNOSIS — H43812 Vitreous degeneration, left eye: Secondary | ICD-10-CM | POA: Diagnosis not present

## 2020-10-15 DIAGNOSIS — M25552 Pain in left hip: Secondary | ICD-10-CM | POA: Diagnosis not present

## 2020-10-20 ENCOUNTER — Encounter: Payer: Self-pay | Admitting: Internal Medicine

## 2020-10-22 ENCOUNTER — Telehealth: Payer: Self-pay

## 2020-10-22 NOTE — Telephone Encounter (Signed)
   [facility] Pre-operative Risk Assessment    HEARTCARE STAFF: - Please ensure there is not already an duplicate clearance open for this procedure. - Under Visit Info/Reason for Call, type in Other and utilize the format Clearance MM/DD/YY or Clearance TBD. Do not use dashes or single digits. - If request is for dental extraction, please clarify the # of teeth to be extracted.  Request for surgical clearance:  1. What type of surgery is being performed? LEFT TOTAL HIP ARTHOPLASTY-ANTERIOR   2. When is this surgery scheduled?12/16/20  3. What type of clearance is required (medical clearance vs. Pharmacy clearance to hold med vs. Both)? BOTH  4. Are there any medications that need to be held prior to surgery and how long? NEEDS ASA INFORMATION   5. Practice name and name of physician performing surgery? [facility]   6. What is the office phone number? [phone]   7.   What is the office fax number? [phone]  8.   Anesthesia type (None, local, MAC, general) ? CHOICE   Jacinta Shoe 10/22/2020, 5:41 PM  _________________________________________________________________   (provider comments below)

## 2020-10-23 NOTE — Telephone Encounter (Signed)
   Primary Cardiologist: Dorris Carnes, MD  Chart reviewed as part of pre-operative protocol coverage. Patient was contacted 10/23/2020 in reference to pre-operative risk assessment for pending surgery as outlined below.  Maria Rich was last seen on 08/21/20 by Dr. Harrington Challenger. H/o HTN, HLD, mild carotid disease. RCRI 0.4% indicating low CV risk based on available information.  Called patient but got VM. LMTCB.  Charlie Pitter, PA-C 10/23/2020, 11:57 AM

## 2020-10-28 ENCOUNTER — Other Ambulatory Visit: Payer: Self-pay

## 2020-10-28 ENCOUNTER — Other Ambulatory Visit: Payer: Medicare Other | Admitting: *Deleted

## 2020-10-28 DIAGNOSIS — E7800 Pure hypercholesterolemia, unspecified: Secondary | ICD-10-CM | POA: Diagnosis not present

## 2020-10-28 LAB — LIPID PANEL
Chol/HDL Ratio: 2.3 ratio (ref 0.0–4.4)
Cholesterol, Total: 178 mg/dL (ref 100–199)
HDL: 79 mg/dL (ref >39)
LDL Chol Calc (NIH): 78 mg/dL (ref 0–99)
Triglycerides: 122 mg/dL (ref 0–149)
VLDL Cholesterol Cal: 21 mg/dL (ref 5–40)

## 2020-10-28 LAB — AST: AST: 29 IU/L (ref 0–40)

## 2020-10-30 ENCOUNTER — Telehealth: Payer: Self-pay | Admitting: *Deleted

## 2020-10-30 DIAGNOSIS — E7800 Pure hypercholesterolemia, unspecified: Secondary | ICD-10-CM

## 2020-10-30 MED ORDER — ROSUVASTATIN CALCIUM 20 MG PO TABS: 20.0000 mg | ORAL_TABLET | Freq: Every day | ORAL | 3 refills | Status: DC

## 2020-10-30 NOTE — Telephone Encounter (Signed)
-----   Message from Dorris Carnes V, MD sent at 10/28/2020  9:31 PM EST ----- LDL is better 78   Still goal is a little lower  I would increase Crestor to 20 mg   F?U lipids in 3 months

## 2020-10-30 NOTE — Telephone Encounter (Signed)
Patient has been informed of results and recommendations. She will increase crestor to 20 mg daily and return for labs on 02/05/21.

## 2020-10-31 NOTE — Telephone Encounter (Signed)
   Primary Cardiologist: Dorris Carnes, MD  Chart reviewed as part of pre-operative protocol coverage. Patient was contacted 10/31/2020 in reference to pre-operative risk assessment for pending surgery as outlined below.  Maria Rich was last seen on 08/21/20 by Dr. Harrington Challenger.  Since that day, [name] has done well.  Therefore, based on ACC/AHA guidelines, the patient would be at acceptable risk for the planned procedure without further cardiovascular testing.   She has a history of hypertension and mild carotid plaque by ultrasound 07/2020. No significant coronary artery disease nor previous stenting. As such, she may hold aspirin 1 week prior to procedure.   The patient was advised that if she develops new symptoms prior to surgery to contact our office to arrange for a follow-up visit, and she verbalized understanding.  I will route this recommendation to the requesting party via Epic fax function and remove from pre-op pool. Please call with questions.  Loel Dubonnet, NP 10/31/2020, 10:12 AM

## 2020-11-06 DIAGNOSIS — M1612 Unilateral primary osteoarthritis, left hip: Secondary | ICD-10-CM | POA: Diagnosis not present

## 2020-11-18 DIAGNOSIS — Z0189 Encounter for other specified special examinations: Secondary | ICD-10-CM | POA: Diagnosis not present

## 2020-11-18 DIAGNOSIS — Z79899 Other long term (current) drug therapy: Secondary | ICD-10-CM | POA: Diagnosis not present

## 2020-11-18 DIAGNOSIS — Z5181 Encounter for therapeutic drug level monitoring: Secondary | ICD-10-CM | POA: Diagnosis not present

## 2020-12-16 DIAGNOSIS — M1612 Unilateral primary osteoarthritis, left hip: Secondary | ICD-10-CM | POA: Diagnosis not present

## 2021-01-12 DIAGNOSIS — Z23 Encounter for immunization: Secondary | ICD-10-CM | POA: Diagnosis not present

## 2021-01-20 DIAGNOSIS — Z96642 Presence of left artificial hip joint: Secondary | ICD-10-CM | POA: Diagnosis not present

## 2021-01-20 DIAGNOSIS — Z471 Aftercare following joint replacement surgery: Secondary | ICD-10-CM | POA: Diagnosis not present

## 2021-02-04 ENCOUNTER — Other Ambulatory Visit: Payer: Medicare Other

## 2021-02-05 ENCOUNTER — Other Ambulatory Visit: Payer: Medicare Other

## 2021-02-05 ENCOUNTER — Other Ambulatory Visit: Payer: Self-pay

## 2021-02-05 DIAGNOSIS — E7800 Pure hypercholesterolemia, unspecified: Secondary | ICD-10-CM

## 2021-02-05 LAB — LIPID PANEL
Chol/HDL Ratio: 2.5 ratio (ref 0.0–4.4)
Cholesterol, Total: 145 mg/dL (ref 100–199)
HDL: 59 mg/dL (ref >39)
LDL Chol Calc (NIH): 59 mg/dL (ref 0–99)
Triglycerides: 163 mg/dL — ABNORMAL HIGH (ref 0–149)
VLDL Cholesterol Cal: 27 mg/dL (ref 5–40)

## 2021-03-26 DIAGNOSIS — M25561 Pain in right knee: Secondary | ICD-10-CM | POA: Diagnosis not present

## 2021-04-03 DIAGNOSIS — M25561 Pain in right knee: Secondary | ICD-10-CM | POA: Diagnosis not present

## 2021-04-08 ENCOUNTER — Other Ambulatory Visit (HOSPITAL_COMMUNITY): Payer: Self-pay | Admitting: Rheumatology

## 2021-04-08 DIAGNOSIS — M064 Inflammatory polyarthropathy: Secondary | ICD-10-CM | POA: Diagnosis not present

## 2021-04-08 DIAGNOSIS — R600 Localized edema: Secondary | ICD-10-CM | POA: Diagnosis not present

## 2021-04-08 DIAGNOSIS — M2550 Pain in unspecified joint: Secondary | ICD-10-CM | POA: Diagnosis not present

## 2021-04-08 DIAGNOSIS — R5383 Other fatigue: Secondary | ICD-10-CM | POA: Diagnosis not present

## 2021-04-08 DIAGNOSIS — Z6824 Body mass index (BMI) 24.0-24.9, adult: Secondary | ICD-10-CM | POA: Diagnosis not present

## 2021-04-08 DIAGNOSIS — R609 Edema, unspecified: Secondary | ICD-10-CM

## 2021-04-09 ENCOUNTER — Other Ambulatory Visit: Payer: Self-pay

## 2021-04-09 ENCOUNTER — Ambulatory Visit (HOSPITAL_COMMUNITY)
Admission: RE | Admit: 2021-04-09 | Discharge: 2021-04-09 | Disposition: A | Discharge status: Home / Self Care | Payer: Medicare Other | Source: Ambulatory Visit | Attending: Rheumatology | Admitting: Rheumatology

## 2021-04-09 DIAGNOSIS — R609 Edema, unspecified: Secondary | ICD-10-CM | POA: Diagnosis not present

## 2021-04-09 DIAGNOSIS — R600 Localized edema: Secondary | ICD-10-CM | POA: Insufficient documentation

## 2021-04-09 NOTE — Progress Notes (Signed)
BLE venous duplex has been completed.  Attempted to give preliminary results to Dr. Cathey Endow nurse, no answer by phone.   Results can be found under chart review under CV PROC. 04/09/2021 11:48 AM Maria Rich RVT, RDMS

## 2021-04-10 DIAGNOSIS — M791 Myalgia, unspecified site: Secondary | ICD-10-CM | POA: Diagnosis not present

## 2021-04-10 DIAGNOSIS — N390 Urinary tract infection, site not specified: Secondary | ICD-10-CM | POA: Diagnosis not present

## 2021-04-23 DIAGNOSIS — R7989 Other specified abnormal findings of blood chemistry: Secondary | ICD-10-CM | POA: Diagnosis not present

## 2021-04-23 DIAGNOSIS — R5383 Other fatigue: Secondary | ICD-10-CM | POA: Diagnosis not present

## 2021-04-23 DIAGNOSIS — M064 Inflammatory polyarthropathy: Secondary | ICD-10-CM | POA: Diagnosis not present

## 2021-04-23 DIAGNOSIS — Z6823 Body mass index (BMI) 23.0-23.9, adult: Secondary | ICD-10-CM | POA: Diagnosis not present

## 2021-04-23 DIAGNOSIS — M2550 Pain in unspecified joint: Secondary | ICD-10-CM | POA: Diagnosis not present

## 2021-04-23 DIAGNOSIS — M545 Low back pain, unspecified: Secondary | ICD-10-CM | POA: Diagnosis not present

## 2021-04-23 DIAGNOSIS — D638 Anemia in other chronic diseases classified elsewhere: Secondary | ICD-10-CM | POA: Diagnosis not present

## 2021-05-27 DIAGNOSIS — Z6823 Body mass index (BMI) 23.0-23.9, adult: Secondary | ICD-10-CM | POA: Diagnosis not present

## 2021-05-27 DIAGNOSIS — R350 Frequency of micturition: Secondary | ICD-10-CM | POA: Diagnosis not present

## 2021-05-27 DIAGNOSIS — M2550 Pain in unspecified joint: Secondary | ICD-10-CM | POA: Diagnosis not present

## 2021-05-27 DIAGNOSIS — M0609 Rheumatoid arthritis without rheumatoid factor, multiple sites: Secondary | ICD-10-CM | POA: Diagnosis not present

## 2021-05-28 DIAGNOSIS — M0609 Rheumatoid arthritis without rheumatoid factor, multiple sites: Secondary | ICD-10-CM | POA: Diagnosis not present

## 2021-06-04 DIAGNOSIS — Z23 Encounter for immunization: Secondary | ICD-10-CM | POA: Diagnosis not present

## 2021-06-05 DIAGNOSIS — H40013 Open angle with borderline findings, low risk, bilateral: Secondary | ICD-10-CM | POA: Diagnosis not present

## 2021-06-05 DIAGNOSIS — Z961 Presence of intraocular lens: Secondary | ICD-10-CM | POA: Diagnosis not present

## 2021-06-05 DIAGNOSIS — H524 Presbyopia: Secondary | ICD-10-CM | POA: Diagnosis not present

## 2021-06-25 DIAGNOSIS — M0609 Rheumatoid arthritis without rheumatoid factor, multiple sites: Secondary | ICD-10-CM | POA: Diagnosis not present

## 2021-07-03 DIAGNOSIS — Z96642 Presence of left artificial hip joint: Secondary | ICD-10-CM | POA: Diagnosis not present

## 2021-07-28 DIAGNOSIS — E663 Overweight: Secondary | ICD-10-CM | POA: Diagnosis not present

## 2021-07-28 DIAGNOSIS — Z6825 Body mass index (BMI) 25.0-25.9, adult: Secondary | ICD-10-CM | POA: Diagnosis not present

## 2021-07-28 DIAGNOSIS — M2550 Pain in unspecified joint: Secondary | ICD-10-CM | POA: Diagnosis not present

## 2021-07-28 DIAGNOSIS — M0609 Rheumatoid arthritis without rheumatoid factor, multiple sites: Secondary | ICD-10-CM | POA: Diagnosis not present

## 2021-07-28 DIAGNOSIS — M159 Polyosteoarthritis, unspecified: Secondary | ICD-10-CM | POA: Diagnosis not present

## 2021-07-28 DIAGNOSIS — M542 Cervicalgia: Secondary | ICD-10-CM | POA: Diagnosis not present

## 2021-07-28 DIAGNOSIS — Z79899 Other long term (current) drug therapy: Secondary | ICD-10-CM | POA: Diagnosis not present

## 2021-08-18 DIAGNOSIS — M5412 Radiculopathy, cervical region: Secondary | ICD-10-CM | POA: Diagnosis not present

## 2021-08-20 DIAGNOSIS — M0609 Rheumatoid arthritis without rheumatoid factor, multiple sites: Secondary | ICD-10-CM | POA: Diagnosis not present

## 2021-08-24 DIAGNOSIS — M25552 Pain in left hip: Secondary | ICD-10-CM | POA: Diagnosis not present

## 2021-08-26 DIAGNOSIS — M47812 Spondylosis without myelopathy or radiculopathy, cervical region: Secondary | ICD-10-CM | POA: Diagnosis not present

## 2021-08-26 DIAGNOSIS — M542 Cervicalgia: Secondary | ICD-10-CM | POA: Diagnosis not present

## 2021-08-27 DIAGNOSIS — H57813 Brow ptosis, bilateral: Secondary | ICD-10-CM | POA: Diagnosis not present

## 2021-08-27 DIAGNOSIS — H53483 Generalized contraction of visual field, bilateral: Secondary | ICD-10-CM | POA: Diagnosis not present

## 2021-08-27 DIAGNOSIS — H02423 Myogenic ptosis of bilateral eyelids: Secondary | ICD-10-CM | POA: Diagnosis not present

## 2021-08-27 DIAGNOSIS — H02834 Dermatochalasis of left upper eyelid: Secondary | ICD-10-CM | POA: Diagnosis not present

## 2021-08-27 DIAGNOSIS — H0279 Other degenerative disorders of eyelid and periocular area: Secondary | ICD-10-CM | POA: Diagnosis not present

## 2021-08-27 DIAGNOSIS — H02413 Mechanical ptosis of bilateral eyelids: Secondary | ICD-10-CM | POA: Diagnosis not present

## 2021-08-27 DIAGNOSIS — H02422 Myogenic ptosis of left eyelid: Secondary | ICD-10-CM | POA: Diagnosis not present

## 2021-08-27 DIAGNOSIS — H02831 Dermatochalasis of right upper eyelid: Secondary | ICD-10-CM | POA: Diagnosis not present

## 2021-08-27 DIAGNOSIS — H02421 Myogenic ptosis of right eyelid: Secondary | ICD-10-CM | POA: Diagnosis not present

## 2021-08-31 DIAGNOSIS — N951 Menopausal and female climacteric states: Secondary | ICD-10-CM | POA: Diagnosis not present

## 2021-08-31 DIAGNOSIS — I10 Essential (primary) hypertension: Secondary | ICD-10-CM | POA: Diagnosis not present

## 2021-08-31 DIAGNOSIS — M0609 Rheumatoid arthritis without rheumatoid factor, multiple sites: Secondary | ICD-10-CM | POA: Diagnosis not present

## 2021-08-31 DIAGNOSIS — K635 Polyp of colon: Secondary | ICD-10-CM | POA: Diagnosis not present

## 2021-08-31 DIAGNOSIS — E7800 Pure hypercholesterolemia, unspecified: Secondary | ICD-10-CM | POA: Diagnosis not present

## 2021-08-31 DIAGNOSIS — I779 Disorder of arteries and arterioles, unspecified: Secondary | ICD-10-CM | POA: Diagnosis not present

## 2021-08-31 DIAGNOSIS — Z122 Encounter for screening for malignant neoplasm of respiratory organs: Secondary | ICD-10-CM | POA: Diagnosis not present

## 2021-08-31 DIAGNOSIS — B009 Herpesviral infection, unspecified: Secondary | ICD-10-CM | POA: Diagnosis not present

## 2021-08-31 DIAGNOSIS — Z13820 Encounter for screening for osteoporosis: Secondary | ICD-10-CM | POA: Diagnosis not present

## 2021-08-31 DIAGNOSIS — Z23 Encounter for immunization: Secondary | ICD-10-CM | POA: Diagnosis not present

## 2021-08-31 DIAGNOSIS — Z0000 Encounter for general adult medical examination without abnormal findings: Secondary | ICD-10-CM | POA: Diagnosis not present

## 2021-08-31 DIAGNOSIS — I2510 Atherosclerotic heart disease of native coronary artery without angina pectoris: Secondary | ICD-10-CM | POA: Diagnosis not present

## 2021-09-01 DIAGNOSIS — M47812 Spondylosis without myelopathy or radiculopathy, cervical region: Secondary | ICD-10-CM | POA: Diagnosis not present

## 2021-09-01 DIAGNOSIS — Z1231 Encounter for screening mammogram for malignant neoplasm of breast: Secondary | ICD-10-CM | POA: Diagnosis not present

## 2021-09-01 DIAGNOSIS — M542 Cervicalgia: Secondary | ICD-10-CM | POA: Diagnosis not present

## 2021-09-01 DIAGNOSIS — M5412 Radiculopathy, cervical region: Secondary | ICD-10-CM | POA: Diagnosis not present

## 2021-09-11 DIAGNOSIS — Z780 Asymptomatic menopausal state: Secondary | ICD-10-CM | POA: Diagnosis not present

## 2021-09-15 DIAGNOSIS — H53483 Generalized contraction of visual field, bilateral: Secondary | ICD-10-CM | POA: Diagnosis not present

## 2021-09-16 ENCOUNTER — Other Ambulatory Visit: Payer: Self-pay | Admitting: *Deleted

## 2021-09-16 DIAGNOSIS — F17210 Nicotine dependence, cigarettes, uncomplicated: Secondary | ICD-10-CM

## 2021-09-16 DIAGNOSIS — Z87891 Personal history of nicotine dependence: Secondary | ICD-10-CM

## 2021-09-17 DIAGNOSIS — M47812 Spondylosis without myelopathy or radiculopathy, cervical region: Secondary | ICD-10-CM | POA: Diagnosis not present

## 2021-09-17 DIAGNOSIS — M5412 Radiculopathy, cervical region: Secondary | ICD-10-CM | POA: Diagnosis not present

## 2021-09-17 DIAGNOSIS — M542 Cervicalgia: Secondary | ICD-10-CM | POA: Diagnosis not present

## 2021-09-22 ENCOUNTER — Ambulatory Visit (INDEPENDENT_AMBULATORY_CARE_PROVIDER_SITE_OTHER): Payer: Medicare Other | Admitting: Physician Assistant

## 2021-09-22 ENCOUNTER — Encounter: Payer: Self-pay | Admitting: Physician Assistant

## 2021-09-22 ENCOUNTER — Other Ambulatory Visit: Payer: Self-pay

## 2021-09-22 DIAGNOSIS — Z86018 Personal history of other benign neoplasm: Secondary | ICD-10-CM | POA: Diagnosis not present

## 2021-09-22 DIAGNOSIS — Z1283 Encounter for screening for malignant neoplasm of skin: Secondary | ICD-10-CM

## 2021-09-22 DIAGNOSIS — L570 Actinic keratosis: Secondary | ICD-10-CM | POA: Diagnosis not present

## 2021-09-22 NOTE — Progress Notes (Signed)
° °  Follow-Up Visit   Subjective  Maria Rich is a 71 y.o. female who presents for the following: Annual Exam (Mid forehead tag v/s isk per patient, history of mild and moderate atypia.).   The following portions of the chart were reviewed this encounter and updated as appropriate:  Tobacco   Allergies   Meds   Problems   Med Hx   Surg Hx   Fam Hx       Objective  Well appearing patient in no apparent distress; mood and affect are within normal limits.  A full examination was performed including scalp, head, eyes, ears, nose, lips, neck, chest, axillae, abdomen, back, buttocks, bilateral upper extremities, bilateral lower extremities, hands, feet, fingers, toes, fingernails, and toenails. All findings within normal limits unless otherwise noted below.  Mid Forehead Erythematous patches with gritty scale.   Assessment & Plan  Actinic keratosis Mid Forehead  Destruction of lesion - Mid Forehead Complexity: simple   Destruction method: cryotherapy   Informed consent: discussed and consent obtained   Timeout:  patient name, date of birth, surgical site, and procedure verified Lesion destroyed using liquid nitrogen: Yes   Cryotherapy cycles:  3 Outcome: patient tolerated procedure well with no complications      I, Perina Salvaggio, PA-C, have reviewed all documentation's for this visit.  The documentation on 09/22/21 for the exam, diagnosis, procedures and orders are all accurate and complete.

## 2021-09-29 DIAGNOSIS — M5412 Radiculopathy, cervical region: Secondary | ICD-10-CM | POA: Diagnosis not present

## 2021-09-29 DIAGNOSIS — M542 Cervicalgia: Secondary | ICD-10-CM | POA: Diagnosis not present

## 2021-09-29 DIAGNOSIS — M47812 Spondylosis without myelopathy or radiculopathy, cervical region: Secondary | ICD-10-CM | POA: Diagnosis not present

## 2021-10-06 ENCOUNTER — Encounter: Payer: Self-pay | Admitting: Acute Care

## 2021-10-06 ENCOUNTER — Other Ambulatory Visit: Payer: Self-pay

## 2021-10-06 ENCOUNTER — Ambulatory Visit (INDEPENDENT_AMBULATORY_CARE_PROVIDER_SITE_OTHER): Payer: Medicare Other | Admitting: Acute Care

## 2021-10-06 DIAGNOSIS — F17210 Nicotine dependence, cigarettes, uncomplicated: Secondary | ICD-10-CM | POA: Diagnosis not present

## 2021-10-06 NOTE — Patient Instructions (Signed)
Thank you for participating in the [facility] Lung Cancer Screening Program. °It was our pleasure to meet you today. °We will call you with the results of your scan within the next few days. °Your scan will be assigned a Lung RADS category score by the physicians reading the scans.  °This Lung RADS score determines follow up scanning.  °See below for description of categories, and follow up screening recommendations. °We will be in touch to schedule your follow up screening annually or based on recommendations of our providers. °We will fax a copy of your scan results to your Primary Care Physician, or the physician who referred you to the program, to ensure they have the results. °Please call the office if you have any questions or concerns regarding your scanning experience or results.  °Our office number is [phone]. °Please speak with Denise Phelps, RN. She is our Lung Cancer Screening RN. °If she is unavailable when you call, please have the office staff send her a message. She will return your call at her earliest convenience. °Remember, if your scan is normal, we will scan you annually as long as you continue to meet the criteria for the program. (Age 55-77, Current smoker or smoker who has quit within the last 15 years). °If you are a smoker, remember, quitting is the single most powerful action that you can take to decrease your risk of lung cancer and other pulmonary, breathing related problems. °We know quitting is hard, and we are here to help.  °Please let us know if there is anything we can do to help you meet your goal of quitting. °If you are a former smoker, congratulations. We are proud of you! Remain smoke free! °Remember you can refer friends or family members through the number above.  °We will screen them to make sure they meet criteria for the program. °Thank you for helping us take better care of you by participating in Lung Screening. ° °You can receive free nicotine replacement therapy  ( patches, gum or mints) by calling 1-800-QUIT NOW. Please call so we can get you on the path to becoming  a non-smoker. I know it is hard, but you can do this! ° °Lung RADS Categories: ° °Lung RADS 1: no nodules or definitely non-concerning nodules.  °Recommendation is for a repeat annual scan in 12 months. ° °Lung RADS 2:  nodules that are non-concerning in appearance and behavior with a very low likelihood of becoming an active cancer. °Recommendation is for a repeat annual scan in 12 months. ° °Lung RADS 3: nodules that are probably non-concerning , includes nodules with a low likelihood of becoming an active cancer.  Recommendation is for a 6-month repeat screening scan. Often noted after an upper respiratory illness. We will be in touch to make sure you have no questions, and to schedule your 6-month scan. ° °Lung RADS 4 A: nodules with concerning findings, recommendation is most often for a follow up scan in 3 months or additional testing based on our provider's assessment of the scan. We will be in touch to make sure you have no questions and to schedule the recommended 3 month follow up scan. ° °Lung RADS 4 B:  indicates findings that are concerning. We will be in touch with you to schedule additional diagnostic testing based on our provider's  assessment of the scan. ° °Hypnosis for smoking cessation  °[employer] °[phone] ° °Acupuncture for smoking cessation  °[facility] °[phone]  °

## 2021-10-06 NOTE — Progress Notes (Signed)
Virtual Visit via Telephone Note  I connected with Maria Rich on 07/28/21 at  2:00 PM EST by telephone and verified that I am speaking with the correct person using two identifiers.  Location: Patient: Home Provider: Working from home   I discussed the limitations, risks, security and privacy concerns of performing an evaluation and management service by telephone and the availability of in person appointments. I also discussed with the patient that there may be a patient responsible charge related to this service. The patient expressed understanding and agreed to proceed.  Shared Decision Making Visit Lung Cancer Screening Program [phone])   Eligibility: Age 71 y.o. Pack Years Smoking History Calculation 22 (# packs/per year x # years smoked) Recent History of coughing up blood  no Unexplained weight loss? no ( >Than 15 pounds within the last 6 months ) Prior History Lung / other cancer no (Diagnosis within the last 5 years already requiring surveillance chest CT Scans). Smoking Status Current Smoker Former Smokers: Years since quit: NA  Quit Date: NA  Visit Components: Discussion included one or more decision making aids. yes Discussion included risk/benefits of screening. yes Discussion included potential follow up diagnostic testing for abnormal scans. yes Discussion included meaning and risk of over diagnosis. yes Discussion included meaning and risk of False Positives. yes Discussion included meaning of total radiation exposure. yes  Counseling Included: Importance of adherence to annual lung cancer LDCT screening. yes Impact of comorbidities on ability to participate in the program. yes Ability and willingness to under diagnostic treatment. yes  Smoking Cessation Counseling: Current Smokers:  Discussed importance of smoking cessation. yes Information about tobacco cessation classes and interventions provided to patient. yes Patient provided with "ticket" for  LDCT Scan. yes Symptomatic Patient. yes  Counseling(Intermediate counseling: > three minutes) [address]406 Diagnosis Code: Tobacco Use Z72.0 Asymptomatic Patient NA  Counseling NA Former Smokers:  Discussed the importance of maintaining cigarette abstinence. yes Diagnosis Code: Personal History of Nicotine Dependence. Y85.027 Information about tobacco cessation classes and interventions provided to patient. Yes Patient provided with "ticket" for LDCT Scan. yes Written Order for Lung Cancer Screening with LDCT placed in Epic. Yes (CT Chest Lung Cancer Screening Low Dose W/O CM) [id number] Z12.2-Screening of respiratory organs Z87.891-Personal history of nicotine dependence   I spent 25 minutes of face to face time with her discussing the risks and benefits of lung cancer screening. We viewed a power point together that explained in detail the above noted topics. We took the time to pause the power point at intervals to allow for questions to be asked and answered to ensure understanding. We discussed that she had taken the single most powerful action possible to decrease her risk of developing lung cancer when she quit smoking. I counseled her to remain smoke free, and to contact me if she ever had the desire to smoke again so that I can provide resources and tools to help support the effort to remain smoke free. We discussed the time and location of the scan, and that either  Doroteo Glassman RN or I will call with the results within  24-48 hours of receiving them. She has my card and contact information in the event she needs to speak with me, in addition to a copy of the power point we reviewed as a resource. She verbalized understanding of all of the above and had no further questions upon leaving the office.     I explained to the patient that there has been a  high incidence of coronary artery disease noted on these exams. I explained that this is a non-gated exam therefore degree or severity cannot be  determined. This patient is on statin therapy. I have asked the patient to follow-up with their PCP regarding any incidental finding of coronary artery disease and management with diet or medication as they feel is clinically indicated. The patient verbalized understanding of the above and had no further questions.  Nhung Danko D. Kenton Kingfisher, NP-C [facility] Personal contact information can be found on Amion  10/06/2021, 12:37 PM

## 2021-10-07 ENCOUNTER — Ambulatory Visit: Payer: Medicare Other

## 2021-10-08 ENCOUNTER — Ambulatory Visit
Admission: RE | Admit: 2021-10-08 | Discharge: 2021-10-08 | Disposition: A | Discharge status: Home / Self Care | Payer: Medicare Other | Source: Ambulatory Visit | Attending: Acute Care | Admitting: Acute Care

## 2021-10-08 ENCOUNTER — Other Ambulatory Visit: Payer: Self-pay

## 2021-10-08 DIAGNOSIS — Z87891 Personal history of nicotine dependence: Secondary | ICD-10-CM | POA: Diagnosis not present

## 2021-10-08 DIAGNOSIS — F17210 Nicotine dependence, cigarettes, uncomplicated: Secondary | ICD-10-CM

## 2021-10-12 ENCOUNTER — Other Ambulatory Visit: Payer: Self-pay | Admitting: Acute Care

## 2021-10-12 DIAGNOSIS — Z87891 Personal history of nicotine dependence: Secondary | ICD-10-CM

## 2021-10-12 DIAGNOSIS — F17210 Nicotine dependence, cigarettes, uncomplicated: Secondary | ICD-10-CM

## 2021-10-15 DIAGNOSIS — M0609 Rheumatoid arthritis without rheumatoid factor, multiple sites: Secondary | ICD-10-CM | POA: Diagnosis not present

## 2021-10-15 DIAGNOSIS — M064 Inflammatory polyarthropathy: Secondary | ICD-10-CM | POA: Diagnosis not present

## 2021-10-19 ENCOUNTER — Other Ambulatory Visit: Payer: Self-pay | Admitting: Internal Medicine

## 2021-10-23 DIAGNOSIS — U071 COVID-19: Secondary | ICD-10-CM | POA: Diagnosis not present

## 2021-10-26 DIAGNOSIS — H02421 Myogenic ptosis of right eyelid: Secondary | ICD-10-CM | POA: Diagnosis not present

## 2021-10-26 DIAGNOSIS — H57813 Brow ptosis, bilateral: Secondary | ICD-10-CM | POA: Diagnosis not present

## 2021-10-26 DIAGNOSIS — H53483 Generalized contraction of visual field, bilateral: Secondary | ICD-10-CM | POA: Diagnosis not present

## 2021-10-26 DIAGNOSIS — H02831 Dermatochalasis of right upper eyelid: Secondary | ICD-10-CM | POA: Diagnosis not present

## 2021-10-26 DIAGNOSIS — H02422 Myogenic ptosis of left eyelid: Secondary | ICD-10-CM | POA: Diagnosis not present

## 2021-10-26 DIAGNOSIS — H02423 Myogenic ptosis of bilateral eyelids: Secondary | ICD-10-CM | POA: Diagnosis not present

## 2021-10-26 DIAGNOSIS — H02834 Dermatochalasis of left upper eyelid: Secondary | ICD-10-CM | POA: Diagnosis not present

## 2021-10-26 DIAGNOSIS — H02413 Mechanical ptosis of bilateral eyelids: Secondary | ICD-10-CM | POA: Diagnosis not present

## 2021-10-26 DIAGNOSIS — H53453 Other localized visual field defect, bilateral: Secondary | ICD-10-CM | POA: Diagnosis not present

## 2021-11-09 NOTE — Progress Notes (Signed)
? ?Cardiology Office Note ? ? ?Date:  11/13/2021  ? ?ID:  Maria Rich, DOB 1951/07/21, MRN [id number] ? ?PCP:  Maria Jewel, MD  ?Cardiologist:   Dorris Carnes, MD  ? ?F/U of CV dz and HTN   ?  ?History of Present Illness: ?Maria Rich is a 71 y.o. female with a history of mild CV dz, HL, HTN .  Carotid USN in November 2021 what showed mild carotid plaquing    ? ?I saw the pt in Dec 2021 ? ?Since seen she says she is heavier.   Exercise program has been interrupted   Now back on track ?She is seen by A Hawkes for RA   On Simponi infusion    ?Had a CT for lung CA screen   Coronary and aortic calcifications noted ?The pt denies CP   Breathing is good    ?Down to 1 cig per day    Uses nicotine patch     ?Denies palpitations    ?Current Outpatient Medications  ?Medication Sig Dispense Refill  ? aspirin 81 MG chewable tablet Chew 81 mg by mouth daily with lunch.    ? Glucosamine HCl (GLUCOSAMINE PO) Take 1 tablet by mouth daily with lunch.    ? golimumab (SIMPONI ARIA) 50 MG/4ML SOLN injection See admin instructions.    ? lisinopril-hydrochlorothiazide (PRINZIDE,ZESTORETIC) 10-12.5 MG tablet TAKE ONE TABLET BY MOUTH ONCE DAILY 90 tablet 2  ? Omega-3 Fatty Acids (FISH OIL PO) Take 1 capsule by mouth in the morning and at bedtime.    ? rosuvastatin (CRESTOR) 20 MG tablet TAKE 1 TABLET BY MOUTH EVERY DAY 90 tablet 0  ? Ascorbic Acid (VITAMIN C PO) Take 1 tablet by mouth daily with lunch. (Patient not taking: Reported on 11/13/2021)    ? ?No current facility-administered medications for this visit.  ? ? ?Allergies:   Patient has no known allergies.  ? ?Past Medical History:  ?Diagnosis Date  ? Atypical mole 11/27/2001  ? left low back wider shave slight to moderate  ? Atypical mole 07/26/2005  ? mod-right mid back  ? Atypical mole 06/13/2008  ? mod-left upperarm (WS)  ? Atypical mole 03/30/2011  ? mild-right inner thigh  ? Atypical mole 07/17/2013  ? mod-left inner knee  ? Atypical mole 12/10/2014  ? mild-left scapula   ? Atypical mole 02/05/2016  ? mild-left upper paraspinal  ? Atypical nevi 09/23/2004  ? left trapezius- moderate  ? Atypical nevi 09/23/2004  ? mod-center upper back  ? Family history of adverse reaction to anesthesia   ? mother had a topical anesthesia , skin irritation, when colonoscopy didn't use any anesthesia  ? Hypertension   ? ? ?Past Surgical History:  ?Procedure Laterality Date  ? EYE SURGERY Bilateral   ? cataract implant surgery,right eye  ? LUMBAR LAMINECTOMY/DECOMPRESSION MICRODISCECTOMY Left 05/09/2020  ? Procedure: Laminectomy for facet/synovial cyst - Lumbar Five-Sacral One - Left;  Surgeon: Eustace Moore, MD;  Location: [facility];  Service: Neurosurgery;  Laterality: Left;  ? TOTAL ABDOMINAL HYSTERECTOMY W/ BILATERAL SALPINGOOPHORECTOMY    ? ? ? ?Social History:  The patient  reports that she has been smoking cigarettes. She has a 20.00 pack-year smoking history. She has never used smokeless tobacco. She reports current alcohol use. She reports that she does not use drugs.  ? ?Family History:  The patient's family history includes Heart attack in her father; Heart disease in her brother and sister.  ? ? ?ROS:  Please see  the history of present illness. All other systems are reviewed and  Negative to the above problem except as noted.  ? ? ?PHYSICAL EXAM: ?VS:  BP 132/68   Pulse 65   Ht 5' (1.524 m)   Wt 130 lb 12.8 oz (59.3 kg)   SpO2 98%   BMI 25.55 kg/m?   ?GEN:  Thin 71 yo  in no acute distress ?HEENT: normal ?Neck: no JVD, no carotid bruits, ?Cardiac: RRR; no murmurs,  No LE edema  ?Respiratory:  clear to auscultation bilaterally, ?GI: soft, nontender, nondistended, + BS  No hepatomegaly  ?MS: no deformity Moving all extremities   ?Skin: warm and dry, no rash ?Neuro:  Strength and sensation are intact ?Psych: euthymic mood, full affect ? ? ?EKG:  EKG is not ordered today   SR 65 bpm    ?Unchanged from 2019   ?Lipid Panel ?   ?Component Value Date/Time  ? CHOL 145 02/05/2021 0831  ? TRIG 163  (H) 02/05/2021 0831  ? HDL 59 02/05/2021 0831  ? CHOLHDL 2.5 02/05/2021 0831  ? CHOLHDL 2.4 08/28/2015 1447  ? VLDL 23 08/28/2015 1447  ? Hominy 59 02/05/2021 0831  ? ?  ? ?Wt Readings from Last 3 Encounters:  ?11/13/21 130 lb 12.8 oz (59.3 kg)  ?08/21/20 120 lb 12.8 oz (54.8 kg)  ?05/09/20 119 lb (54 kg)  ?  ? ? ?ASSESSMENT AND PLAN: ? ?1  HTN Blood pressure controlled    ? ?2  CAD  On CT scan   No symtpoms of angina   Keep risk factors controlled ? ?3  CV dz  Plaquing remains mild      Last scan in July 2022   ? ?4   HL   Continue statin   ? ?F/U in 1 year ? ? ?Signed, ?Dorris Carnes, MD  ?11/13/2021 9:59 AM    ?[facility] ?[place], [place], [place]  [address] ?Phone: [phone]; Fax: [phone]  ? ? ? ?

## 2021-11-13 ENCOUNTER — Ambulatory Visit (INDEPENDENT_AMBULATORY_CARE_PROVIDER_SITE_OTHER): Payer: Medicare Other | Admitting: Internal Medicine

## 2021-11-13 ENCOUNTER — Encounter: Payer: Self-pay | Admitting: Internal Medicine

## 2021-11-13 ENCOUNTER — Other Ambulatory Visit: Payer: Self-pay

## 2021-11-13 VITALS — BP 132/68 | HR 65 | Ht 60.0 in | Wt 130.8 lb

## 2021-11-13 DIAGNOSIS — I679 Cerebrovascular disease, unspecified: Secondary | ICD-10-CM

## 2021-11-13 DIAGNOSIS — I10 Essential (primary) hypertension: Secondary | ICD-10-CM | POA: Diagnosis not present

## 2021-11-13 NOTE — Patient Instructions (Signed)
Medication Instructions:  Your physician recommends that you continue on your current medications as directed. Please refer to the Current Medication list given to you today.  *If you need a refill on your cardiac medications before your next appointment, please call your pharmacy*   Lab Work: none If you have labs (blood work) drawn today and your tests are completely normal, you will receive your results only by: MyChart Message (if you have MyChart) OR A paper copy in the mail If you have any lab test that is abnormal or we need to change your treatment, we will call you to review the results.   Testing/Procedures: none   Follow-Up: At [facility], you and your health needs are our priority.  As part of our continuing mission to provide you with exceptional heart care, we have created designated Provider Care Teams.  These Care Teams include your primary Cardiologist (physician) and Advanced Practice Providers (APPs -  Physician Assistants and Nurse Practitioners) who all work together to provide you with the care you need, when you need it.  We recommend signing up for the patient portal called "MyChart".  Sign up information is provided on this After Visit Summary.  MyChart is used to connect with patients for Virtual Visits (Telemedicine).  Patients are able to view lab/test results, encounter notes, upcoming appointments, etc.  Non-urgent messages can be sent to your provider as well.   To learn more about what you can do with MyChart, go to [email or web address].    Your next appointment:   1 year(s)  The format for your next appointment:   In Person  Provider:   Paula Ross, MD     Other Instructions   

## 2021-12-10 DIAGNOSIS — M0609 Rheumatoid arthritis without rheumatoid factor, multiple sites: Secondary | ICD-10-CM | POA: Diagnosis not present

## 2022-01-18 ENCOUNTER — Other Ambulatory Visit: Payer: Self-pay | Admitting: Internal Medicine

## 2022-01-22 DIAGNOSIS — Z961 Presence of intraocular lens: Secondary | ICD-10-CM | POA: Diagnosis not present

## 2022-01-22 DIAGNOSIS — H40013 Open angle with borderline findings, low risk, bilateral: Secondary | ICD-10-CM | POA: Diagnosis not present

## 2022-01-26 DIAGNOSIS — M0609 Rheumatoid arthritis without rheumatoid factor, multiple sites: Secondary | ICD-10-CM | POA: Diagnosis not present

## 2022-01-26 DIAGNOSIS — Z6825 Body mass index (BMI) 25.0-25.9, adult: Secondary | ICD-10-CM | POA: Diagnosis not present

## 2022-01-26 DIAGNOSIS — E663 Overweight: Secondary | ICD-10-CM | POA: Diagnosis not present

## 2022-01-26 DIAGNOSIS — M1991 Primary osteoarthritis, unspecified site: Secondary | ICD-10-CM | POA: Diagnosis not present

## 2022-01-26 DIAGNOSIS — Z79899 Other long term (current) drug therapy: Secondary | ICD-10-CM | POA: Diagnosis not present

## 2022-02-04 DIAGNOSIS — M0609 Rheumatoid arthritis without rheumatoid factor, multiple sites: Secondary | ICD-10-CM | POA: Diagnosis not present

## 2022-03-19 IMAGING — CT CT CHEST LUNG CANCER SCREENING LOW DOSE W/O CM
1 series · 10 of 10 positions shown, 13 images · non-contrast
Comparison: None.

CLINICAL DATA: 70-year-old female with 22 pack-year history of
smoking. Lung cancer screening.



[ct lung segmentation data · axial · 0.62mm/px · z∈[-250,-250]mm · 10 of 272 frames shown]
[frame 1/272  mediastinal]
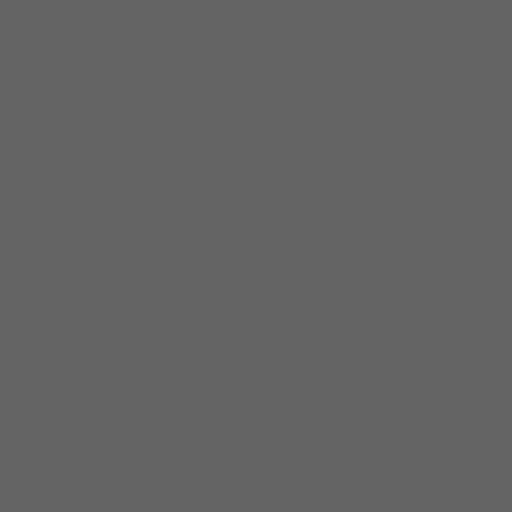
[frame 1/272  lung]
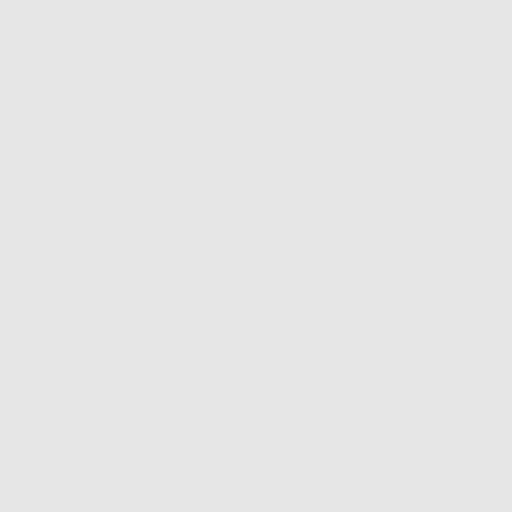
[frame 31/272  lung]
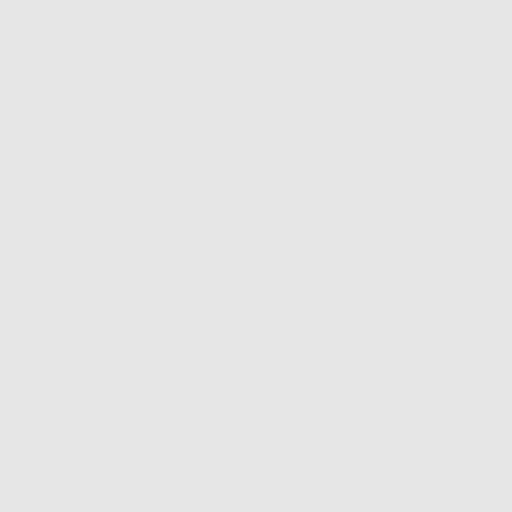
[frame 61/272  lung]
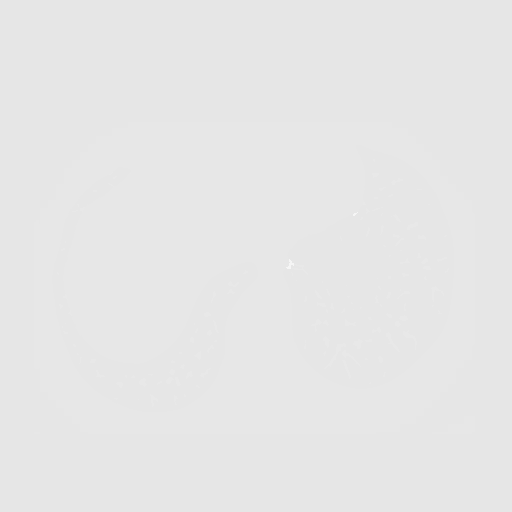
[frame 91/272  lung]
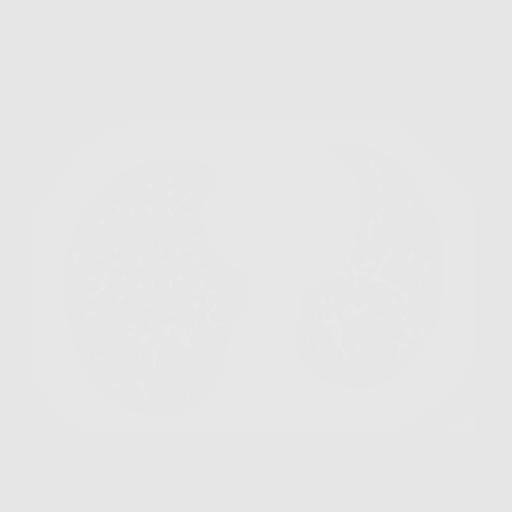
[frame 121/272  mediastinal]
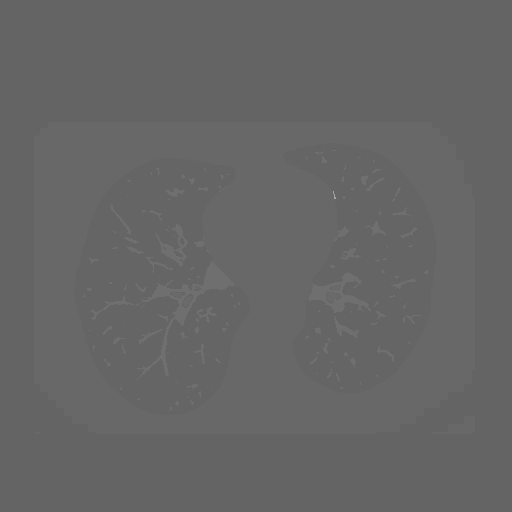
[frame 121/272  lung]
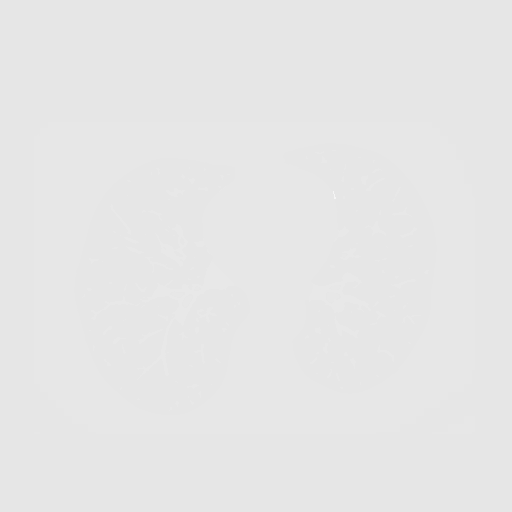
[frame 151/272  lung]
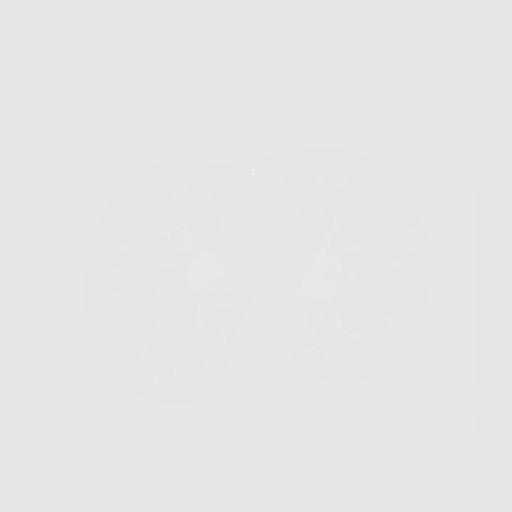
[frame 181/272  lung]
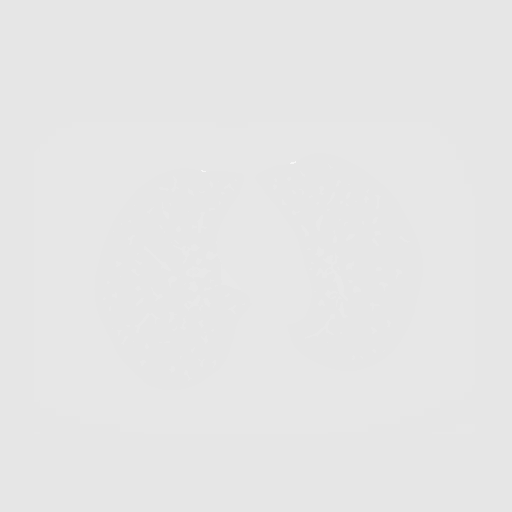
[frame 211/272  lung]
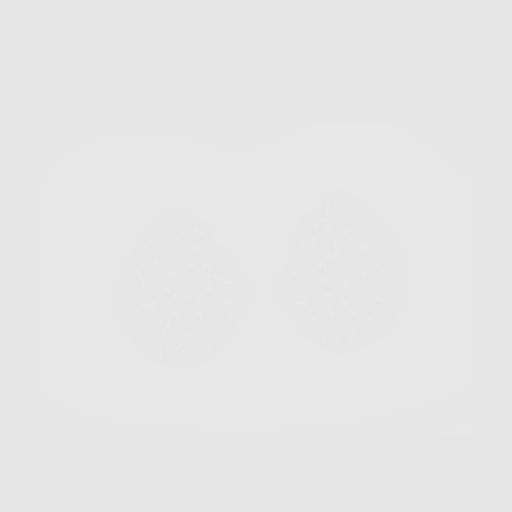
[frame 241/272  mediastinal]
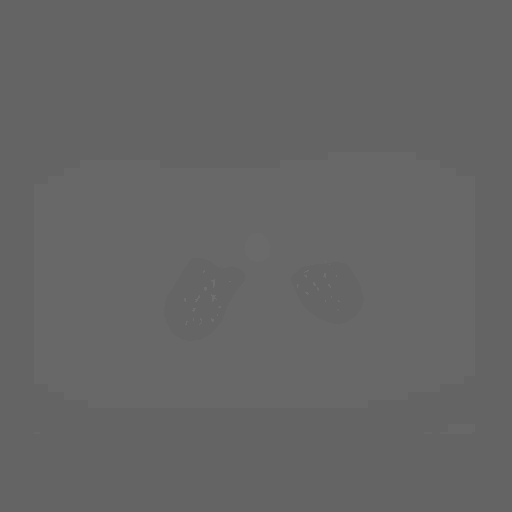
[frame 241/272  lung]
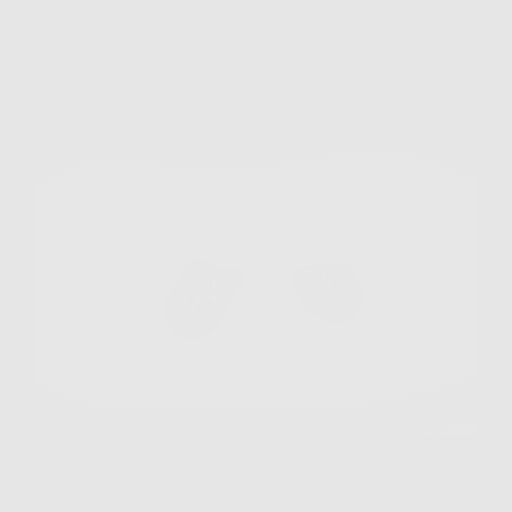
[frame 272/272  lung]
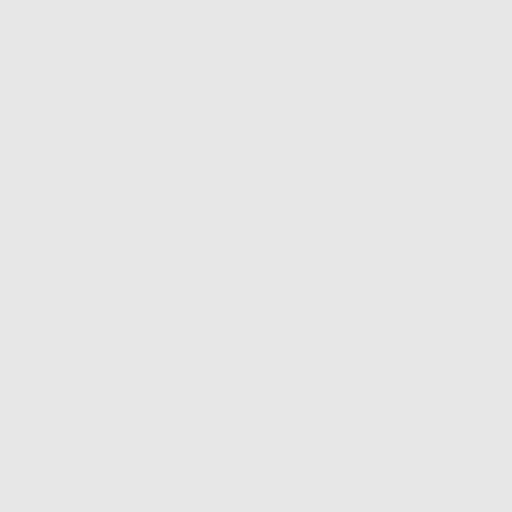

[10 of 10 positions shown; findings below may reference images not displayed]

FINDINGS: Cardiovascular: The heart size is normal. No substantial pericardial
effusion. Coronary artery calcification is evident. Mild
atherosclerotic calcification is noted in the wall of the thoracic
aorta.

Mediastinum/Nodes: No mediastinal lymphadenopathy. No evidence for
gross hilar lymphadenopathy although assessment is limited by the
lack of intravenous contrast on the current study. The esophagus has
normal imaging features. There is no axillary lymphadenopathy.

Lungs/Pleura: Centrilobular emphsyema noted. Scattered bilateral
calcified and noncalcified pulmonary nodules identified. Dominant
noncalcified pulmonary nodule measures 3 mm in the anterior right
lower lobe (image 140). No suspicious pulmonary nodule or mass on
today's study. No focal airspace consolidation. No pleural effusion.

Upper Abdomen: 9 mm hypodensity in the dome of the liver approaches
water density, compatible with a cyst. Visualized upper abdomen
otherwise unremarkable.

Musculoskeletal: No worrisome lytic or sclerotic osseous
abnormality.
IMPRESSION: 1. Lung-RADS 2, benign appearance or behavior. Continue annual
screening with low-dose chest CT without contrast in 12 months.
2.  Emphysema (GRDRH-BAA.E) and Aortic Atherosclerosis (GRDRH-170.0)

## 2022-04-01 DIAGNOSIS — M0609 Rheumatoid arthritis without rheumatoid factor, multiple sites: Secondary | ICD-10-CM | POA: Diagnosis not present

## 2022-04-23 DIAGNOSIS — D123 Benign neoplasm of transverse colon: Secondary | ICD-10-CM | POA: Diagnosis not present

## 2022-04-23 DIAGNOSIS — D122 Benign neoplasm of ascending colon: Secondary | ICD-10-CM | POA: Diagnosis not present

## 2022-04-23 DIAGNOSIS — K648 Other hemorrhoids: Secondary | ICD-10-CM | POA: Diagnosis not present

## 2022-04-23 DIAGNOSIS — Z09 Encounter for follow-up examination after completed treatment for conditions other than malignant neoplasm: Secondary | ICD-10-CM | POA: Diagnosis not present

## 2022-04-23 DIAGNOSIS — Z86010 Personal history of colonic polyps: Secondary | ICD-10-CM | POA: Diagnosis not present

## 2022-04-23 DIAGNOSIS — Q438 Other specified congenital malformations of intestine: Secondary | ICD-10-CM | POA: Diagnosis not present

## 2022-04-23 DIAGNOSIS — D125 Benign neoplasm of sigmoid colon: Secondary | ICD-10-CM | POA: Diagnosis not present

## 2022-04-23 DIAGNOSIS — K5730 Diverticulosis of large intestine without perforation or abscess without bleeding: Secondary | ICD-10-CM | POA: Diagnosis not present

## 2022-04-29 DIAGNOSIS — D122 Benign neoplasm of ascending colon: Secondary | ICD-10-CM | POA: Diagnosis not present

## 2022-04-29 DIAGNOSIS — Z96642 Presence of left artificial hip joint: Secondary | ICD-10-CM | POA: Diagnosis not present

## 2022-04-29 DIAGNOSIS — D123 Benign neoplasm of transverse colon: Secondary | ICD-10-CM | POA: Diagnosis not present

## 2022-05-11 DIAGNOSIS — M25552 Pain in left hip: Secondary | ICD-10-CM | POA: Diagnosis not present

## 2022-05-18 DIAGNOSIS — M25552 Pain in left hip: Secondary | ICD-10-CM | POA: Diagnosis not present

## 2022-05-20 DIAGNOSIS — M25552 Pain in left hip: Secondary | ICD-10-CM | POA: Diagnosis not present

## 2022-05-25 DIAGNOSIS — M25552 Pain in left hip: Secondary | ICD-10-CM | POA: Diagnosis not present

## 2022-05-27 DIAGNOSIS — Z111 Encounter for screening for respiratory tuberculosis: Secondary | ICD-10-CM | POA: Diagnosis not present

## 2022-05-27 DIAGNOSIS — R5383 Other fatigue: Secondary | ICD-10-CM | POA: Diagnosis not present

## 2022-05-27 DIAGNOSIS — M064 Inflammatory polyarthropathy: Secondary | ICD-10-CM | POA: Diagnosis not present

## 2022-05-27 DIAGNOSIS — Z79899 Other long term (current) drug therapy: Secondary | ICD-10-CM | POA: Diagnosis not present

## 2022-05-27 DIAGNOSIS — M0609 Rheumatoid arthritis without rheumatoid factor, multiple sites: Secondary | ICD-10-CM | POA: Diagnosis not present

## 2022-06-01 DIAGNOSIS — M25552 Pain in left hip: Secondary | ICD-10-CM | POA: Diagnosis not present

## 2022-06-03 DIAGNOSIS — M25552 Pain in left hip: Secondary | ICD-10-CM | POA: Diagnosis not present

## 2022-06-08 DIAGNOSIS — Z23 Encounter for immunization: Secondary | ICD-10-CM | POA: Diagnosis not present

## 2022-06-10 DIAGNOSIS — M25552 Pain in left hip: Secondary | ICD-10-CM | POA: Diagnosis not present

## 2022-06-17 DIAGNOSIS — M25552 Pain in left hip: Secondary | ICD-10-CM | POA: Diagnosis not present

## 2022-07-08 DIAGNOSIS — M25552 Pain in left hip: Secondary | ICD-10-CM | POA: Diagnosis not present

## 2022-07-22 DIAGNOSIS — M25552 Pain in left hip: Secondary | ICD-10-CM | POA: Diagnosis not present

## 2022-07-22 DIAGNOSIS — Z96642 Presence of left artificial hip joint: Secondary | ICD-10-CM | POA: Diagnosis not present

## 2022-07-23 DIAGNOSIS — M0609 Rheumatoid arthritis without rheumatoid factor, multiple sites: Secondary | ICD-10-CM | POA: Diagnosis not present

## 2022-07-28 DIAGNOSIS — M7072 Other bursitis of hip, left hip: Secondary | ICD-10-CM | POA: Diagnosis not present

## 2022-07-29 DIAGNOSIS — M0609 Rheumatoid arthritis without rheumatoid factor, multiple sites: Secondary | ICD-10-CM | POA: Diagnosis not present

## 2022-07-29 DIAGNOSIS — M1991 Primary osteoarthritis, unspecified site: Secondary | ICD-10-CM | POA: Diagnosis not present

## 2022-07-29 DIAGNOSIS — Z79899 Other long term (current) drug therapy: Secondary | ICD-10-CM | POA: Diagnosis not present

## 2022-07-29 DIAGNOSIS — Z6825 Body mass index (BMI) 25.0-25.9, adult: Secondary | ICD-10-CM | POA: Diagnosis not present

## 2022-07-29 DIAGNOSIS — E663 Overweight: Secondary | ICD-10-CM | POA: Diagnosis not present

## 2022-08-09 ENCOUNTER — Telehealth: Payer: Self-pay

## 2022-08-09 NOTE — Telephone Encounter (Signed)
Received message from VM. Returned call to patient to schedule annual LDCT.  No answer. Left VM and call back number

## 2022-09-01 DIAGNOSIS — M545 Low back pain, unspecified: Secondary | ICD-10-CM | POA: Diagnosis not present

## 2022-09-01 DIAGNOSIS — M5451 Vertebrogenic low back pain: Secondary | ICD-10-CM | POA: Diagnosis not present

## 2022-09-09 DIAGNOSIS — M5451 Vertebrogenic low back pain: Secondary | ICD-10-CM | POA: Diagnosis not present

## 2022-09-12 DIAGNOSIS — B349 Viral infection, unspecified: Secondary | ICD-10-CM | POA: Diagnosis not present

## 2022-09-12 DIAGNOSIS — R197 Diarrhea, unspecified: Secondary | ICD-10-CM | POA: Diagnosis not present

## 2022-09-12 DIAGNOSIS — R0981 Nasal congestion: Secondary | ICD-10-CM | POA: Diagnosis not present

## 2022-09-21 DIAGNOSIS — I119 Hypertensive heart disease without heart failure: Secondary | ICD-10-CM | POA: Diagnosis not present

## 2022-09-21 DIAGNOSIS — Z720 Tobacco use: Secondary | ICD-10-CM | POA: Diagnosis not present

## 2022-09-21 DIAGNOSIS — J449 Chronic obstructive pulmonary disease, unspecified: Secondary | ICD-10-CM | POA: Diagnosis not present

## 2022-09-21 DIAGNOSIS — N951 Menopausal and female climacteric states: Secondary | ICD-10-CM | POA: Diagnosis not present

## 2022-09-21 DIAGNOSIS — E7800 Pure hypercholesterolemia, unspecified: Secondary | ICD-10-CM | POA: Diagnosis not present

## 2022-09-21 DIAGNOSIS — K635 Polyp of colon: Secondary | ICD-10-CM | POA: Diagnosis not present

## 2022-09-21 DIAGNOSIS — B009 Herpesviral infection, unspecified: Secondary | ICD-10-CM | POA: Diagnosis not present

## 2022-09-21 DIAGNOSIS — I700 Atherosclerosis of aorta: Secondary | ICD-10-CM | POA: Diagnosis not present

## 2022-09-21 DIAGNOSIS — Z1231 Encounter for screening mammogram for malignant neoplasm of breast: Secondary | ICD-10-CM | POA: Diagnosis not present

## 2022-09-21 DIAGNOSIS — I2510 Atherosclerotic heart disease of native coronary artery without angina pectoris: Secondary | ICD-10-CM | POA: Diagnosis not present

## 2022-09-21 DIAGNOSIS — Z79899 Other long term (current) drug therapy: Secondary | ICD-10-CM | POA: Diagnosis not present

## 2022-09-21 DIAGNOSIS — Z0000 Encounter for general adult medical examination without abnormal findings: Secondary | ICD-10-CM | POA: Diagnosis not present

## 2022-09-21 DIAGNOSIS — I779 Disorder of arteries and arterioles, unspecified: Secondary | ICD-10-CM | POA: Diagnosis not present

## 2022-09-21 DIAGNOSIS — M0609 Rheumatoid arthritis without rheumatoid factor, multiple sites: Secondary | ICD-10-CM | POA: Diagnosis not present

## 2022-09-23 ENCOUNTER — Ambulatory Visit: Payer: Medicare Other | Admitting: Physician Assistant

## 2022-10-08 ENCOUNTER — Ambulatory Visit
Admission: RE | Admit: 2022-10-08 | Discharge: 2022-10-08 | Disposition: A | Discharge status: Home / Self Care | Payer: Medicare Other | Source: Ambulatory Visit | Attending: Internal Medicine | Admitting: Internal Medicine

## 2022-10-08 DIAGNOSIS — H524 Presbyopia: Secondary | ICD-10-CM | POA: Diagnosis not present

## 2022-10-08 DIAGNOSIS — F17210 Nicotine dependence, cigarettes, uncomplicated: Secondary | ICD-10-CM

## 2022-10-08 DIAGNOSIS — Z87891 Personal history of nicotine dependence: Secondary | ICD-10-CM

## 2022-10-08 DIAGNOSIS — H40053 Ocular hypertension, bilateral: Secondary | ICD-10-CM | POA: Diagnosis not present

## 2022-10-08 DIAGNOSIS — H40013 Open angle with borderline findings, low risk, bilateral: Secondary | ICD-10-CM | POA: Diagnosis not present

## 2022-10-08 DIAGNOSIS — Z961 Presence of intraocular lens: Secondary | ICD-10-CM | POA: Diagnosis not present

## 2022-10-12 ENCOUNTER — Other Ambulatory Visit: Payer: Self-pay

## 2022-10-12 DIAGNOSIS — F17210 Nicotine dependence, cigarettes, uncomplicated: Secondary | ICD-10-CM

## 2022-10-12 DIAGNOSIS — Z87891 Personal history of nicotine dependence: Secondary | ICD-10-CM

## 2022-10-21 DIAGNOSIS — L570 Actinic keratosis: Secondary | ICD-10-CM | POA: Diagnosis not present

## 2022-10-21 DIAGNOSIS — L821 Other seborrheic keratosis: Secondary | ICD-10-CM | POA: Diagnosis not present

## 2022-10-21 DIAGNOSIS — L989 Disorder of the skin and subcutaneous tissue, unspecified: Secondary | ICD-10-CM | POA: Diagnosis not present

## 2022-10-21 DIAGNOSIS — D2239 Melanocytic nevi of other parts of face: Secondary | ICD-10-CM | POA: Diagnosis not present

## 2022-10-21 DIAGNOSIS — D225 Melanocytic nevi of trunk: Secondary | ICD-10-CM | POA: Diagnosis not present

## 2022-10-21 DIAGNOSIS — D485 Neoplasm of uncertain behavior of skin: Secondary | ICD-10-CM | POA: Diagnosis not present

## 2022-10-21 DIAGNOSIS — D2262 Melanocytic nevi of left upper limb, including shoulder: Secondary | ICD-10-CM | POA: Diagnosis not present

## 2022-11-11 DIAGNOSIS — D485 Neoplasm of uncertain behavior of skin: Secondary | ICD-10-CM | POA: Diagnosis not present

## 2022-11-11 DIAGNOSIS — D487 Neoplasm of uncertain behavior of other specified sites: Secondary | ICD-10-CM | POA: Diagnosis not present

## 2022-11-15 ENCOUNTER — Encounter: Payer: Self-pay | Admitting: Internal Medicine

## 2022-11-15 ENCOUNTER — Ambulatory Visit: Payer: Medicare Other | Attending: Internal Medicine | Admitting: Internal Medicine

## 2022-11-15 VITALS — BP 140/80 | HR 54 | Ht 61.0 in | Wt 128.2 lb

## 2022-11-15 DIAGNOSIS — R7309 Other abnormal glucose: Secondary | ICD-10-CM | POA: Insufficient documentation

## 2022-11-15 DIAGNOSIS — E559 Vitamin D deficiency, unspecified: Secondary | ICD-10-CM | POA: Diagnosis not present

## 2022-11-15 DIAGNOSIS — E7800 Pure hypercholesterolemia, unspecified: Secondary | ICD-10-CM | POA: Diagnosis not present

## 2022-11-15 DIAGNOSIS — I10 Essential (primary) hypertension: Secondary | ICD-10-CM | POA: Insufficient documentation

## 2022-11-15 MED ORDER — ROSUVASTATIN CALCIUM 10 MG PO TABS: 10.0000 mg | ORAL_TABLET | Freq: Every day | ORAL | 3 refills | Status: DC

## 2022-11-15 NOTE — Patient Instructions (Addendum)
Medication Instructions:   1) decrease dose of rosuvastatin (Crestor) to 10 mg daily  *If you need a refill on your cardiac medications before your next appointment, please call your pharmacy*   Lab Work: Please return in August for blood work (lipomed panel, hgA1c, vit D)  If you have labs (blood work) drawn today and your tests are completely normal, you will receive your results only by: [place] (if you have MyChart) OR A paper copy in the mail If you have any lab test that is abnormal or we need to change your treatment, we will call you to review the results.   Testing/Procedures: none   Follow-Up: At [facility], you and your health needs are our priority.  As part of our continuing mission to provide you with exceptional heart care, we have created designated Provider Care Teams.  These Care Teams include your primary Cardiologist (physician) and Advanced Practice Providers (APPs -  Physician Assistants and Nurse Practitioners) who all work together to provide you with the care you need, when you need it.    Your next appointment:   12 month(s)  Provider:   Dorris Carnes, MD

## 2022-11-15 NOTE — Progress Notes (Signed)
Cardiology Office Note   Date:  11/15/2022   ID:  Maria Rich, DOB 1951-05-13, MRN [id number]  PCP:  Mckinley Jewel, MD  Cardiologist:   Dorris Carnes, MD   F/U of CV dz and HTN     History of Present Illness: Maria Rich is a 72 y.o. female with a history of mild CV dz,CAD (on CT scan for lung cancer screening),  HL, HTN .  Carotid USN in November 2021 what showed mild carotid plaquing     I saw the pt in March 2023  Since seen she has been doing OK from a cardiac standpoint   Denies CP  NO SOB     Denies palpitations    She says her BP at home is usually lower, in 110s /     Current Outpatient Medications  Medication Sig Dispense Refill   Ascorbic Acid (VITAMIN C PO) Take 1 tablet by mouth daily with lunch.     aspirin 81 MG chewable tablet Chew 81 mg by mouth daily with lunch.     Glucosamine HCl (GLUCOSAMINE PO) Take 1 tablet by mouth daily with lunch.     golimumab (SIMPONI ARIA) 50 MG/4ML SOLN injection See admin instructions.     lisinopril-hydrochlorothiazide (PRINZIDE,ZESTORETIC) 10-12.5 MG tablet TAKE ONE TABLET BY MOUTH ONCE DAILY 90 tablet 2   Omega-3 Fatty Acids (FISH OIL PO) Take 1 capsule by mouth in the morning and at bedtime.     rosuvastatin (CRESTOR) 20 MG tablet TAKE 1 TABLET BY MOUTH EVERY DAY 90 tablet 0   No current facility-administered medications for this visit.    Allergies:   Patient has no known allergies.   Past Medical History:  Diagnosis Date   Atypical mole 11/27/2001   left low back wider shave slight to moderate   Atypical mole 07/26/2005   mod-right mid back   Atypical mole 06/13/2008   mod-left upperarm (WS)   Atypical mole 03/30/2011   mild-right inner thigh   Atypical mole 07/17/2013   mod-left inner knee   Atypical mole 12/10/2014   mild-left scapula   Atypical mole 02/05/2016   mild-left upper paraspinal   Atypical nevi 09/23/2004   left trapezius- moderate   Atypical nevi 09/23/2004   mod-center upper back    Family history of adverse reaction to anesthesia    mother had a topical anesthesia , skin irritation, when colonoscopy didn't use any anesthesia   Hypertension     Past Surgical History:  Procedure Laterality Date   EYE SURGERY Bilateral    cataract implant surgery,right eye   LUMBAR LAMINECTOMY/DECOMPRESSION MICRODISCECTOMY Left 05/09/2020   Procedure: Laminectomy for facet/synovial cyst - Lumbar Five-Sacral One - Left;  Surgeon: Eustace Moore, MD;  Location: [facility];  Service: Neurosurgery;  Laterality: Left;   TOTAL ABDOMINAL HYSTERECTOMY W/ BILATERAL SALPINGOOPHORECTOMY       Social History:  The patient  reports that she has been smoking cigarettes. She has a 20.00 pack-year smoking history. She has never used smokeless tobacco. She reports current alcohol use. She reports that she does not use drugs.   Family History:  The patient's family history includes Heart attack in her father; Heart disease in her brother and sister.    ROS:  Please see the history of present illness. All other systems are reviewed and  Negative to the above problem except as noted.    PHYSICAL EXAM: VS:  BP (!) 140/80   Pulse (!) 54   Ht  $'5\' 1"'K$  (1.549 m)   Wt 128 lb 3.2 oz (58.2 kg)   SpO2 99%   BMI 24.22 kg/m   GEN:  Thin 72 yo  in no acute distress HEENT: normal Neck: no JVD, no carotid bruit Cardiac: RRR, S1, S2  No murmur.  No LE edema  Respiratory:  clear to auscultation  GI: soft, nontender, nondistended, + BS  No hepatomegaly  MS: no deformity Moving all extremities   Skin: warm and dry, no rash Neuro:  Strength and sensation are intact Psych: euthymic mood, full affect   EKG:  SB 54 bpm  Possible septal MI  Lipid Panel    Component Value Date/Time   CHOL 145 02/05/2021 0831   TRIG 163 (H) 02/05/2021 0831   HDL 59 02/05/2021 0831   CHOLHDL 2.5 02/05/2021 0831   CHOLHDL 2.4 08/28/2015 1447   VLDL 23 08/28/2015 1447   LDLCALC 59 02/05/2021 0831      Wt Readings from Last 3  Encounters:  11/15/22 128 lb 3.2 oz (58.2 kg)  11/13/21 130 lb 12.8 oz (59.3 kg)  08/21/20 120 lb 12.8 oz (54.8 kg)      ASSESSMENT AND PLAN:  1  HTN BP is better at home   I have asked her to follow in periodically and also to take cuff to appt to make sure it is accurate     2  CAD Calcifications seen on chest CT  Pt asymptomatic  3  CV dz  Plaquing remains mild      Last scan in July 2022    4   HL   Continue statin  LDL 45  HDL 60  Trig 214   Cut crestor to 10  5  Metabolics   [id number] is 5.8   Would cut Crestor to 10 mg    watch diet  Check Hgb A1C in AUg along with lipomed  F/U in clinic in 1 year   Signed, Dorris Carnes, MD  11/15/2022 9:40 AM    [address] [place], [place], [place]  [address] Phone: [phone]; Fax: [phone]

## 2022-11-16 DIAGNOSIS — Z79899 Other long term (current) drug therapy: Secondary | ICD-10-CM | POA: Diagnosis not present

## 2022-11-16 DIAGNOSIS — M0609 Rheumatoid arthritis without rheumatoid factor, multiple sites: Secondary | ICD-10-CM | POA: Diagnosis not present

## 2022-11-27 DIAGNOSIS — M7072 Other bursitis of hip, left hip: Secondary | ICD-10-CM | POA: Diagnosis not present

## 2023-01-11 DIAGNOSIS — M0609 Rheumatoid arthritis without rheumatoid factor, multiple sites: Secondary | ICD-10-CM | POA: Diagnosis not present

## 2023-01-26 DIAGNOSIS — D1801 Hemangioma of skin and subcutaneous tissue: Secondary | ICD-10-CM | POA: Diagnosis not present

## 2023-01-26 DIAGNOSIS — L820 Inflamed seborrheic keratosis: Secondary | ICD-10-CM | POA: Diagnosis not present

## 2023-01-27 DIAGNOSIS — Z6824 Body mass index (BMI) 24.0-24.9, adult: Secondary | ICD-10-CM | POA: Diagnosis not present

## 2023-01-27 DIAGNOSIS — Z79899 Other long term (current) drug therapy: Secondary | ICD-10-CM | POA: Diagnosis not present

## 2023-01-27 DIAGNOSIS — M1991 Primary osteoarthritis, unspecified site: Secondary | ICD-10-CM | POA: Diagnosis not present

## 2023-01-27 DIAGNOSIS — M0609 Rheumatoid arthritis without rheumatoid factor, multiple sites: Secondary | ICD-10-CM | POA: Diagnosis not present

## 2023-01-27 DIAGNOSIS — R252 Cramp and spasm: Secondary | ICD-10-CM | POA: Diagnosis not present

## 2023-02-22 DIAGNOSIS — M5451 Vertebrogenic low back pain: Secondary | ICD-10-CM | POA: Diagnosis not present

## 2023-02-22 DIAGNOSIS — M5432 Sciatica, left side: Secondary | ICD-10-CM | POA: Diagnosis not present

## 2023-02-22 DIAGNOSIS — M7072 Other bursitis of hip, left hip: Secondary | ICD-10-CM | POA: Diagnosis not present

## 2023-03-02 DIAGNOSIS — M791 Myalgia, unspecified site: Secondary | ICD-10-CM | POA: Diagnosis not present

## 2023-03-08 DIAGNOSIS — M0609 Rheumatoid arthritis without rheumatoid factor, multiple sites: Secondary | ICD-10-CM | POA: Diagnosis not present

## 2023-03-30 DIAGNOSIS — M7061 Trochanteric bursitis, right hip: Secondary | ICD-10-CM | POA: Diagnosis not present

## 2023-03-30 DIAGNOSIS — M0609 Rheumatoid arthritis without rheumatoid factor, multiple sites: Secondary | ICD-10-CM | POA: Diagnosis not present

## 2023-03-30 DIAGNOSIS — M1991 Primary osteoarthritis, unspecified site: Secondary | ICD-10-CM | POA: Diagnosis not present

## 2023-03-30 DIAGNOSIS — M7062 Trochanteric bursitis, left hip: Secondary | ICD-10-CM | POA: Diagnosis not present

## 2023-03-30 DIAGNOSIS — M545 Low back pain, unspecified: Secondary | ICD-10-CM | POA: Diagnosis not present

## 2023-03-30 DIAGNOSIS — Z79899 Other long term (current) drug therapy: Secondary | ICD-10-CM | POA: Diagnosis not present

## 2023-03-30 DIAGNOSIS — Z6824 Body mass index (BMI) 24.0-24.9, adult: Secondary | ICD-10-CM | POA: Diagnosis not present

## 2023-03-31 DIAGNOSIS — R200 Anesthesia of skin: Secondary | ICD-10-CM | POA: Diagnosis not present

## 2023-03-31 DIAGNOSIS — M961 Postlaminectomy syndrome, not elsewhere classified: Secondary | ICD-10-CM | POA: Diagnosis not present

## 2023-04-08 DIAGNOSIS — H40053 Ocular hypertension, bilateral: Secondary | ICD-10-CM | POA: Diagnosis not present

## 2023-04-08 DIAGNOSIS — Z961 Presence of intraocular lens: Secondary | ICD-10-CM | POA: Diagnosis not present

## 2023-04-28 ENCOUNTER — Ambulatory Visit: Payer: Medicare Other | Attending: Internal Medicine

## 2023-04-28 DIAGNOSIS — R7309 Other abnormal glucose: Secondary | ICD-10-CM | POA: Diagnosis not present

## 2023-04-28 DIAGNOSIS — I10 Essential (primary) hypertension: Secondary | ICD-10-CM

## 2023-04-28 DIAGNOSIS — E559 Vitamin D deficiency, unspecified: Secondary | ICD-10-CM

## 2023-04-28 DIAGNOSIS — E7800 Pure hypercholesterolemia, unspecified: Secondary | ICD-10-CM

## 2023-04-29 ENCOUNTER — Encounter: Payer: Self-pay | Admitting: Internal Medicine

## 2023-04-29 LAB — LIPOPROTEIN A (LPA): Lipoprotein (a): <8.4 nmol/L (ref <75.0)

## 2023-04-29 LAB — NMR, LIPOPROFILE
Cholesterol, Total: 146 mg/dL (ref 100–199)
HDL Particle Number: 46 umol/L (ref >=30.5)
HDL-C: 69 mg/dL (ref >39)
LDL Particle Number: 760 nmol/L (ref <1000)
LDL Size: 19.8 nm — ABNORMAL LOW (ref >20.5)
LDL-C (NIH Calc): 56 mg/dL (ref 0–99)
LP-IR Score: 25 (ref <=45)
Small LDL Particle Number: 507 nmol/L (ref <=527)
Triglycerides: 124 mg/dL (ref 0–149)

## 2023-04-29 LAB — VITAMIN D 25 HYDROXY (VIT D DEFICIENCY, FRACTURES): Vit D, 25-Hydroxy: 27.8 ng/mL — ABNORMAL LOW (ref 30.0–100.0)

## 2023-04-29 LAB — HEMOGLOBIN A1C
Est. average glucose Bld gHb Est-mCnc: 117 mg/dL
Hgb A1c MFr Bld: 5.7 % — ABNORMAL HIGH (ref 4.8–5.6)

## 2023-04-29 LAB — APOLIPOPROTEIN B: Apolipoprotein B: 54 mg/dL (ref <90)

## 2023-05-02 ENCOUNTER — Other Ambulatory Visit: Payer: Self-pay

## 2023-05-02 MED ORDER — VITAMIN D 125 MCG (5000 UT) PO CAPS: 5000.0000 [IU] | ORAL_CAPSULE | Freq: Every day | ORAL | Status: AC

## 2023-05-03 DIAGNOSIS — Z111 Encounter for screening for respiratory tuberculosis: Secondary | ICD-10-CM | POA: Diagnosis not present

## 2023-05-03 DIAGNOSIS — M0609 Rheumatoid arthritis without rheumatoid factor, multiple sites: Secondary | ICD-10-CM | POA: Diagnosis not present

## 2023-05-03 DIAGNOSIS — R5383 Other fatigue: Secondary | ICD-10-CM | POA: Diagnosis not present

## 2023-05-03 DIAGNOSIS — Z79899 Other long term (current) drug therapy: Secondary | ICD-10-CM | POA: Diagnosis not present

## 2023-05-04 ENCOUNTER — Encounter: Payer: Self-pay | Admitting: Internal Medicine

## 2023-05-04 DIAGNOSIS — M7072 Other bursitis of hip, left hip: Secondary | ICD-10-CM | POA: Diagnosis not present

## 2023-05-04 DIAGNOSIS — M5451 Vertebrogenic low back pain: Secondary | ICD-10-CM | POA: Diagnosis not present

## 2023-05-04 DIAGNOSIS — M5432 Sciatica, left side: Secondary | ICD-10-CM | POA: Diagnosis not present

## 2023-05-05 DIAGNOSIS — H40053 Ocular hypertension, bilateral: Secondary | ICD-10-CM | POA: Diagnosis not present

## 2023-05-09 ENCOUNTER — Encounter: Payer: Self-pay | Admitting: Internal Medicine

## 2023-05-09 DIAGNOSIS — I10 Essential (primary) hypertension: Secondary | ICD-10-CM

## 2023-05-12 MED ORDER — LISINOPRIL-HYDROCHLOROTHIAZIDE 10-12.5 MG PO TABS: 1.0000 | ORAL_TABLET | Freq: Every day | ORAL | 2 refills | Status: DC

## 2023-05-16 DIAGNOSIS — Z23 Encounter for immunization: Secondary | ICD-10-CM | POA: Diagnosis not present

## 2023-05-19 MED ORDER — LISINOPRIL-HYDROCHLOROTHIAZIDE 20-25 MG PO TABS: 1.0000 | ORAL_TABLET | Freq: Every day | ORAL | 3 refills | Status: DC

## 2023-05-19 NOTE — Telephone Encounter (Signed)
Please call in new prescriptin for lisinopril / hydrochlorothiazide   20/25    Pt will need BMET in about 2 wks  Continue to keep track of BP readings

## 2023-06-02 ENCOUNTER — Ambulatory Visit: Payer: Medicare Other | Attending: Internal Medicine

## 2023-06-02 DIAGNOSIS — I10 Essential (primary) hypertension: Secondary | ICD-10-CM

## 2023-06-03 LAB — BASIC METABOLIC PANEL
BUN/Creatinine Ratio: 33 — ABNORMAL HIGH (ref 12–28)
BUN: 26 mg/dL (ref 8–27)
CO2: 22 mmol/L (ref 20–29)
Calcium: 10.6 mg/dL — ABNORMAL HIGH (ref 8.7–10.3)
Chloride: 105 mmol/L (ref 96–106)
Creatinine, Ser: 0.79 mg/dL (ref 0.57–1.00)
Glucose: 92 mg/dL (ref 70–99)
Potassium: 3.9 mmol/L (ref 3.5–5.2)
Sodium: 140 mmol/L (ref 134–144)
eGFR: 80 mL/min/{1.73_m2} (ref >59)

## 2023-06-24 ENCOUNTER — Encounter: Payer: Self-pay | Admitting: Diagnostic Neuroimaging

## 2023-06-24 ENCOUNTER — Ambulatory Visit: Payer: Medicare Other | Admitting: Diagnostic Neuroimaging

## 2023-06-24 VITALS — BP 141/75 | HR 60 | Ht 61.0 in | Wt 124.6 lb

## 2023-06-24 DIAGNOSIS — R200 Anesthesia of skin: Secondary | ICD-10-CM

## 2023-06-24 NOTE — Patient Instructions (Addendum)
NUMBNESS IN FEET (left foot since 2021; bilateral feet since April 2024) - mild symptoms; not much progression; some spontaneous improvement in last few months; do not suspect underlying systemic cause of neuropathy; symptoms likely related to low back issues plus local foot related nerve compression / irritation - consider neuropathy labs (check B12, TSH; she would like testing done with PCP)

## 2023-06-24 NOTE — Progress Notes (Signed)
[facility]  PATIENT: Maria Rich DOB: 1951-06-25  REFERRING CLINICIAN: Sheran Luz, MD HISTORY FROM: patient  REASON FOR VISIT: new consult   HISTORICAL  CHIEF COMPLAINT:  Chief Complaint  Patient presents with   New Patient (Initial Visit)    Rm 6, alone.  Numbness 2021 L foot (toes) then 6 months ago R foot , burning pain,, tingling, feet. ? If neuropathy.    HISTORY OF PRESENT ILLNESS:   72 year old female here for evaluation of numbness in the feet.  2021 patient had low back pain rating to the left leg.  She underwent back surgery which seemed to improve some symptoms.  However she noticed some new numbness in the left foot.  This is been stable over several years.  In April 2024 she noticed new onset of bilateral numbness in the feet, left worse than right.  She has been to spine clinic and pain management.  Since that time symptoms have spontaneously improved.   REVIEW OF SYSTEMS: Full 14 system review of systems performed and negative with exception of: as per HPI.  ALLERGIES: No Known Allergies  HOME MEDICATIONS: Outpatient Medications Prior to Visit  Medication Sig Dispense Refill   Ascorbic Acid (VITAMIN C PO) Take 1 tablet by mouth daily with lunch.     aspirin 81 MG chewable tablet Chew 81 mg by mouth daily with lunch.     Cholecalciferol (VITAMIN D) 125 MCG (5000 UT) CAPS Take 5,000 Units by mouth daily.     Glucosamine HCl (GLUCOSAMINE PO) Take 1 tablet by mouth daily with lunch.     golimumab (SIMPONI ARIA) 50 MG/4ML SOLN injection See admin instructions.     lisinopril-hydrochlorothiazide (ZESTORETIC) 20-25 MG tablet Take 1 tablet by mouth daily. 90 tablet 3   Omega-3 Fatty Acids (FISH OIL PO) Take 1 capsule by mouth in the morning and at bedtime.     rosuvastatin (CRESTOR) 10 MG tablet Take 1 tablet (10 mg total) by mouth daily. 90 tablet 3   No facility-administered medications prior to visit.    PAST MEDICAL  HISTORY: Past Medical History:  Diagnosis Date   Atypical mole 11/27/2001   left low back wider shave slight to moderate   Atypical mole 07/26/2005   mod-right mid back   Atypical mole 06/13/2008   mod-left upperarm (WS)   Atypical mole 03/30/2011   mild-right inner thigh   Atypical mole 07/17/2013   mod-left inner knee   Atypical mole 12/10/2014   mild-left scapula   Atypical mole 02/05/2016   mild-left upper paraspinal   Atypical nevi 09/23/2004   left trapezius- moderate   Atypical nevi 09/23/2004   mod-center upper back   Family history of adverse reaction to anesthesia    mother had a topical anesthesia , skin irritation, when colonoscopy didn't use any anesthesia   High cholesterol    Hypertension    Rheumatoid arthritis (HCC)    2022 summer    PAST SURGICAL HISTORY: Past Surgical History:  Procedure Laterality Date   EYE SURGERY Bilateral    cataract implant surgery,right eye   HIP SURGERY Left    2022   LUMBAR LAMINECTOMY/DECOMPRESSION MICRODISCECTOMY Left 05/09/2020   Procedure: Laminectomy for facet/synovial cyst - Lumbar Five-Sacral One - Left;  Surgeon: Tia Alert, MD;  Location: [facility] OR;  Service: Neurosurgery;  Laterality: Left;   TOTAL ABDOMINAL HYSTERECTOMY W/ BILATERAL SALPINGOOPHORECTOMY      FAMILY HISTORY: Family History  Problem Relation Age of Onset   Heart  attack Father    Heart disease Sister    Heart disease Brother     SOCIAL HISTORY: Social History   Socioeconomic History   Marital status: Widowed    Spouse name: Not on file   Number of children: Not on file   Years of education: Not on file   Highest education level: Not on file  Occupational History   Not on file  Tobacco Use   Smoking status: Every Day    Current packs/day: 0.50    Average packs/day: 0.5 packs/day for 40.0 years (20.0 ttl pk-yrs)    Types: Cigarettes   Smokeless tobacco: Never   Tobacco comments:    trying to quit and has been 3 weeks  Vaping Use    Vaping status: Former  Substance and Sexual Activity   Alcohol use: Yes    Comment: beer on weekends   Drug use: No   Sexual activity: Not on file  Other Topics Concern   Not on file  Social History Narrative   [name]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  She smokes one half pack of cigarettes per day, down to 4 cigs per day, using a hormone patch   Caffiene 3 cups coffee in am   Retired.    Social Determinants of Health   Financial Resource Strain: Not on file  Food Insecurity: Not on file  Transportation Needs: Not on file  Physical Activity: Not on file  Stress: Not on file  Social Connections: Not on file  Intimate Partner Violence: Not on file     PHYSICAL EXAM  GENERAL EXAM/CONSTITUTIONAL: Vitals:  Vitals:   06/24/23 0844 06/24/23 0852  BP: (!) 164/77 (!) 141/75  Pulse: 63 60  Weight: 124 lb 9.6 oz (56.5 kg)   Height: 5\' 1"  (1.549 m)    Body mass index is 23.54 kg/m. Wt Readings from Last 3 Encounters:  06/24/23 124 lb 9.6 oz (56.5 kg)  11/15/22 128 lb 3.2 oz (58.2 kg)  11/13/21 130 lb 12.8 oz (59.3 kg)   Patient is in no distress; well developed, nourished and groomed; neck is supple  CARDIOVASCULAR: Examination of carotid arteries is normal; no carotid bruits Regular rate and rhythm, no murmurs Examination of peripheral vascular system by observation and palpation is normal  EYES: Ophthalmoscopic exam of optic discs and posterior segments is normal; no papilledema or hemorrhages No results found.  MUSCULOSKELETAL: Gait, strength, tone, movements noted in Neurologic exam below  NEUROLOGIC: MENTAL STATUS:      No data to display         awake, alert, oriented to person, place and time recent and remote memory intact normal attention and concentration language fluent, comprehension intact, naming intact fund of knowledge appropriate  CRANIAL NERVE:  2nd - no papilledema on fundoscopic exam 2nd, 3rd, 4th, 6th  - pupils equal and reactive to light, visual fields full to confrontation, extraocular muscles intact, no nystagmus 5th - facial sensation symmetric 7th - facial strength symmetric 8th - hearing intact 9th - palate elevates symmetrically, uvula midline 11th - shoulder shrug symmetric 12th - tongue protrusion midline  MOTOR:  normal bulk and tone, full strength in the BUE, BLE  SENSORY:  normal and symmetric to light touch, pinprick, temperature, vibration; EXCEPT SLIGHTLY HYPERSENSITIVE IN TOES TO PP  COORDINATION:  finger-nose-finger, fine finger movements normal  REFLEXES:  deep tendon reflexes 1+ and symmetric  GAIT/STATION:  narrow based gait    DIAGNOSTIC DATA (LABS,  IMAGING, TESTING) - I reviewed patient records, labs, notes, testing and imaging myself where available.  Lab Results  Component Value Date   WBC 9.1 08/21/2020   HGB 13.8 08/21/2020   HCT 40.8 08/21/2020   MCV 99 (H) 08/21/2020   PLT 346 08/21/2020      Component Value Date/Time   NA 140 06/02/2023 0824   K 3.9 06/02/2023 0824   CL 105 06/02/2023 0824   CO2 22 06/02/2023 0824   GLUCOSE 92 06/02/2023 0824   GLUCOSE 110 (H) 05/06/2020 1133   BUN 26 06/02/2023 0824   CREATININE 0.79 06/02/2023 0824   CREATININE 0.77 08/28/2015 1447   CALCIUM 10.6 (H) 06/02/2023 0824   PROT 6.7 07/21/2017 0835   ALBUMIN 3.8 07/04/2013 0745   AST 29 10/28/2020 1012   ALT 11 07/21/2017 0835   ALKPHOS 50 07/04/2013 0745   BILITOT 0.4 07/21/2017 0835   GFRNONAA 66 08/21/2020 0921   GFRAA 77 08/21/2020 0921   Lab Results  Component Value Date   CHOL 145 02/05/2021   HDL 59 02/05/2021   LDLCALC 59 02/05/2021   TRIG 163 (H) 02/05/2021   CHOLHDL 2.5 02/05/2021   Lab Results  Component Value Date   HGBA1C 5.7 (H) 04/28/2023   No results found for: "VITAMINB12" Lab Results  Component Value Date   TSH 2.080 08/21/2020      ASSESSMENT AND PLAN  72 y.o. year old female here with:   Dx:  1. Numbness  in feet      PLAN:  NUMBNESS IN FEET (left foot since 2021; bilateral feet since April 2024) - mild symptoms; not much progression; some spontaneous improvement in last few months; do not suspect underlying systemic cause of neuropathy; symptoms likely related to low back issues plus local foot related nerve compression / irritation - consider neuropathy labs (check B12, TSH; she would like testing done with PCP)  Return for return to PCP, pending if symptoms worsen or fail to improve.    Suanne Marker, MD 06/24/2023, 10:00 AM Certified in Neurology, Neurophysiology and Neuroimaging  [facility] [address], [address] [place], [place] [address] [phone]

## 2023-06-28 DIAGNOSIS — M0609 Rheumatoid arthritis without rheumatoid factor, multiple sites: Secondary | ICD-10-CM | POA: Diagnosis not present

## 2023-07-28 DIAGNOSIS — M1991 Primary osteoarthritis, unspecified site: Secondary | ICD-10-CM | POA: Diagnosis not present

## 2023-07-28 DIAGNOSIS — M0609 Rheumatoid arthritis without rheumatoid factor, multiple sites: Secondary | ICD-10-CM | POA: Diagnosis not present

## 2023-07-28 DIAGNOSIS — Z79899 Other long term (current) drug therapy: Secondary | ICD-10-CM | POA: Diagnosis not present

## 2023-07-28 DIAGNOSIS — Z6823 Body mass index (BMI) 23.0-23.9, adult: Secondary | ICD-10-CM | POA: Diagnosis not present

## 2023-08-19 DIAGNOSIS — M25551 Pain in right hip: Secondary | ICD-10-CM | POA: Diagnosis not present

## 2023-08-23 DIAGNOSIS — M0609 Rheumatoid arthritis without rheumatoid factor, multiple sites: Secondary | ICD-10-CM | POA: Diagnosis not present

## 2023-08-24 DIAGNOSIS — M25551 Pain in right hip: Secondary | ICD-10-CM | POA: Diagnosis not present

## 2023-08-31 ENCOUNTER — Encounter: Payer: Self-pay | Admitting: Gastroenterology

## 2023-09-01 DIAGNOSIS — H40053 Ocular hypertension, bilateral: Secondary | ICD-10-CM | POA: Diagnosis not present

## 2023-09-11 ENCOUNTER — Encounter (HOSPITAL_BASED_OUTPATIENT_CLINIC_OR_DEPARTMENT_OTHER): Payer: Self-pay | Admitting: Emergency Medicine

## 2023-09-11 ENCOUNTER — Ambulatory Visit (HOSPITAL_BASED_OUTPATIENT_CLINIC_OR_DEPARTMENT_OTHER)
Admission: EM | Admit: 2023-09-11 | Discharge: 2023-09-11 | Disposition: A | Discharge status: Home / Self Care | Payer: Medicare Other | Attending: Internal Medicine | Admitting: Internal Medicine

## 2023-09-11 DIAGNOSIS — B349 Viral infection, unspecified: Secondary | ICD-10-CM | POA: Diagnosis not present

## 2023-09-11 NOTE — ED Provider Notes (Signed)
[facility] C[facility]    CSN: [id number] Arrival date & time: 09/11/23  7564      History   Chief Complaint Chief Complaint  Patient presents with   Cough    HPI Maria Rich is a 72 y.o. female.    Cough Associated symptoms: ear pain, rhinorrhea and sore throat   Associated symptoms: no chills, no fever, no shortness of breath and no wheezing   Not feeling well for 4 days started with clear rhinorrhea and then developed nasal congestion, cough, ear pressure.  Had scratchy throat first day of illness resolved denies fever, chills, sweats, chest pain, shortness of breath, nausea, vomiting, diarrhea, rash or skin changes, body aches, fatigue.  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  Past Medical History:  Diagnosis Date   Atypical mole 11/27/2001   left low back wider shave slight to moderate   Atypical mole 07/26/2005   mod-right mid back   Atypical mole 06/13/2008   mod-left upperarm (WS)   Atypical mole 03/30/2011   mild-right inner thigh   Atypical mole 07/17/2013   mod-left inner knee   Atypical mole 12/10/2014   mild-left scapula   Atypical mole 02/05/2016   mild-left upper paraspinal   Atypical nevi 09/23/2004   left trapezius- moderate   Atypical nevi 09/23/2004   mod-center upper back   Family history of adverse reaction to anesthesia    mother had a topical anesthesia , skin irritation, when colonoscopy didn't use any anesthesia   High cholesterol    Hypertension    Rheumatoid arthritis (HCC)    2022 summer    Patient Active Problem List   Diagnosis Date Noted   Paronychia 06/26/2013   Pseudophakia 08/25/2012   After-cataract 08/25/2011   Hypertensive disorder 07/11/2011   Tobacco abuse 07/11/2011   PURE HYPERCHOLESTEROLEMIA 06/10/2010   CAROTID ARTERY DISEASE 05/08/2009    Past Surgical History:  Procedure Laterality Date   EYE SURGERY Bilateral    cataract implant surgery,right eye   HIP SURGERY Left     2022   LUMBAR LAMINECTOMY/DECOMPRESSION MICRODISCECTOMY Left 05/09/2020   Procedure: Laminectomy for facet/synovial cyst - Lumbar Five-Sacral One - Left;  Surgeon: Tia Alert, MD;  Location: [facility] OR;  Service: Neurosurgery;  Laterality: Left;   TOTAL ABDOMINAL HYSTERECTOMY W/ BILATERAL SALPINGOOPHORECTOMY      OB History   No obstetric history on file.      Home Medications    Prior to Admission medications   Medication Sig Start Date End Date Taking? Authorizing Provider  Ascorbic Acid (VITAMIN C PO) Take 1 tablet by mouth daily with lunch.    [provider]  aspirin 81 MG chewable tablet Chew 81 mg by mouth daily with lunch.    [provider]  Cholecalciferol (VITAMIN D) 125 MCG (5000 UT) CAPS Take 5,000 Units by mouth daily. 05/02/23   Pricilla Riffle, MD  Glucosamine HCl (GLUCOSAMINE PO) Take 1 tablet by mouth daily with lunch.    [provider]  golimumab (SIMPONI ARIA) 50 MG/4ML SOLN injection See admin instructions.    [provider]  lisinopril-hydrochlorothiazide (ZESTORETIC) 20-25 MG tablet Take 1 tablet by mouth daily. 05/19/23   Pricilla Riffle, MD  Omega-3 Fatty Acids (FISH OIL PO) Take 1 capsule by mouth in the morning and at bedtime.    [provider]  rosuvastatin (CRESTOR) 10 MG tablet Take 1 tablet (10 mg total) by mouth daily. 11/15/22   Dietrich Pates  V, MD    Family History Family History  Problem Relation Age of Onset   Heart attack Father    Heart disease Sister    Heart disease Brother     Social History Social History   Tobacco Use   Smoking status: Every Day    Current packs/day: 0.50    Average packs/day: 0.5 packs/day for 40.0 years (20.0 ttl pk-yrs)    Types: Cigarettes   Smokeless tobacco: Never   Tobacco comments:    trying to quit and has been 3 weeks  Vaping Use   Vaping status: Former  Substance Use Topics   Alcohol use: Yes    Comment: beer on weekends   Drug use: No     Allergies    Patient has no known allergies.   Review of Systems Review of Systems  Constitutional:  Positive for fatigue. Negative for chills and fever.  HENT:  Positive for congestion, ear pain, rhinorrhea and sore throat. Negative for ear discharge and trouble swallowing.   Respiratory:  Positive for cough. Negative for shortness of breath and wheezing.      Physical Exam Triage Vital Signs ED Triage Vitals [09/11/23 0830]  Encounter Vitals Group     BP 133/61     Systolic BP Percentile      Diastolic BP Percentile      Pulse Rate 75     Resp 18     Temp 97.9 F (36.6 C)     Temp Source Oral     SpO2 98 %     Weight      Height      Head Circumference      Peak Flow      Pain Score 0     Pain Loc      Pain Education      Exclude from Growth Chart    No data found.  Updated Vital Signs BP 133/61 (BP Location: Right Arm)   Pulse 75   Temp 97.9 F (36.6 C) (Oral)   Resp 18   SpO2 98%   Visual Acuity Right Eye Distance:   Left Eye Distance:   Bilateral Distance:    Right Eye Near:   Left Eye Near:    Bilateral Near:     Physical Exam Vitals and nursing note reviewed.  Constitutional:      Appearance: She is not ill-appearing.  HENT:     Head: Normocephalic and atraumatic.     Right Ear: Tympanic membrane and ear canal normal.     Left Ear: Tympanic membrane and ear canal normal.     Nose: Congestion present. No rhinorrhea.     Mouth/Throat:     Mouth: Mucous membranes are moist.     Pharynx: Oropharynx is clear. No oropharyngeal exudate or posterior oropharyngeal erythema.  Eyes:     Conjunctiva/sclera: Conjunctivae normal.  Cardiovascular:     Rate and Rhythm: Normal rate and regular rhythm.     Heart sounds: Normal heart sounds.  Pulmonary:     Effort: Pulmonary effort is normal. No respiratory distress.     Breath sounds: Normal breath sounds. No wheezing or rales.  Musculoskeletal:     Cervical back: Neck supple.  Lymphadenopathy:     Cervical: No  cervical adenopathy.  Skin:    General: Skin is warm and dry.  Neurological:     Mental Status: She is alert and oriented to person, place, and time.      UC Treatments /  Results  Labs (all labs ordered are listed, but only abnormal results are displayed) Labs Reviewed - No data to display  EKG   Radiology No results found.  Procedures Procedures (including critical care time)  Medications Ordered in UC Medications - No data to display  Initial Impression / Assessment and Plan / UC Course  I have reviewed the triage vital signs and the nursing notes.  Pertinent labs & imaging results that were available during my care of the patient were reviewed by me and considered in my medical decision making (see chart for details).     13-year-old female with URI symptoms for 3 to 4 days no fever, no known COVID or flu exposures.  She is well-appearing, nontoxic, afebrile, has mild nasal congestion on exam otherwise exam normal.  Discussed with patient likely viral URI, offered prescription cough medication she declined, recommend OTC meds for symptomatic relief Final Clinical Impressions(s) / UC Diagnoses   Final diagnoses:  Viral illness     Discharge Instructions      Continue your over-the-counter medications for symptom relief. Follow-up for new, worsening symptoms or concerns   ED Prescriptions   None    PDMP not reviewed this encounter.   Meliton Rattan, [place] 09/11/23 [phone]

## 2023-09-11 NOTE — Discharge Instructions (Signed)
Continue your over-the-counter medications for symptom relief. Follow-up for new, worsening symptoms or concerns

## 2023-09-11 NOTE — ED Triage Notes (Signed)
Pt c/o coughing, runny nose, congestion, headache x 4 days. Pt has taken OTC Mucinex

## 2023-09-27 DIAGNOSIS — M069 Rheumatoid arthritis, unspecified: Secondary | ICD-10-CM | POA: Diagnosis not present

## 2023-09-27 DIAGNOSIS — N958 Other specified menopausal and perimenopausal disorders: Secondary | ICD-10-CM | POA: Diagnosis not present

## 2023-09-27 DIAGNOSIS — D7589 Other specified diseases of blood and blood-forming organs: Secondary | ICD-10-CM | POA: Diagnosis not present

## 2023-09-27 DIAGNOSIS — I119 Hypertensive heart disease without heart failure: Secondary | ICD-10-CM | POA: Diagnosis not present

## 2023-09-27 DIAGNOSIS — Z0000 Encounter for general adult medical examination without abnormal findings: Secondary | ICD-10-CM | POA: Diagnosis not present

## 2023-09-27 DIAGNOSIS — B009 Herpesviral infection, unspecified: Secondary | ICD-10-CM | POA: Diagnosis not present

## 2023-09-27 DIAGNOSIS — M0609 Rheumatoid arthritis without rheumatoid factor, multiple sites: Secondary | ICD-10-CM | POA: Diagnosis not present

## 2023-09-27 DIAGNOSIS — Z1231 Encounter for screening mammogram for malignant neoplasm of breast: Secondary | ICD-10-CM | POA: Diagnosis not present

## 2023-09-27 DIAGNOSIS — I779 Disorder of arteries and arterioles, unspecified: Secondary | ICD-10-CM | POA: Diagnosis not present

## 2023-09-27 DIAGNOSIS — J449 Chronic obstructive pulmonary disease, unspecified: Secondary | ICD-10-CM | POA: Diagnosis not present

## 2023-09-27 DIAGNOSIS — K635 Polyp of colon: Secondary | ICD-10-CM | POA: Diagnosis not present

## 2023-09-27 DIAGNOSIS — E7800 Pure hypercholesterolemia, unspecified: Secondary | ICD-10-CM | POA: Diagnosis not present

## 2023-09-27 DIAGNOSIS — I700 Atherosclerosis of aorta: Secondary | ICD-10-CM | POA: Diagnosis not present

## 2023-09-27 DIAGNOSIS — Z7952 Long term (current) use of systemic steroids: Secondary | ICD-10-CM | POA: Diagnosis not present

## 2023-09-27 DIAGNOSIS — I2510 Atherosclerotic heart disease of native coronary artery without angina pectoris: Secondary | ICD-10-CM | POA: Diagnosis not present

## 2023-10-10 ENCOUNTER — Other Ambulatory Visit: Payer: Medicare Other

## 2023-10-11 ENCOUNTER — Ambulatory Visit
Admission: RE | Admit: 2023-10-11 | Discharge: 2023-10-11 | Disposition: A | Discharge status: Home / Self Care | Payer: Medicare Other | Source: Ambulatory Visit | Attending: Acute Care | Admitting: Acute Care

## 2023-10-11 DIAGNOSIS — Z87891 Personal history of nicotine dependence: Secondary | ICD-10-CM | POA: Diagnosis not present

## 2023-10-11 DIAGNOSIS — F17210 Nicotine dependence, cigarettes, uncomplicated: Secondary | ICD-10-CM

## 2023-10-18 DIAGNOSIS — R5383 Other fatigue: Secondary | ICD-10-CM | POA: Diagnosis not present

## 2023-10-18 DIAGNOSIS — Z79899 Other long term (current) drug therapy: Secondary | ICD-10-CM | POA: Diagnosis not present

## 2023-10-18 DIAGNOSIS — M0609 Rheumatoid arthritis without rheumatoid factor, multiple sites: Secondary | ICD-10-CM | POA: Diagnosis not present

## 2023-10-20 ENCOUNTER — Other Ambulatory Visit: Payer: Self-pay

## 2023-10-20 DIAGNOSIS — F17210 Nicotine dependence, cigarettes, uncomplicated: Secondary | ICD-10-CM

## 2023-10-20 DIAGNOSIS — Z122 Encounter for screening for malignant neoplasm of respiratory organs: Secondary | ICD-10-CM

## 2023-10-20 DIAGNOSIS — Z87891 Personal history of nicotine dependence: Secondary | ICD-10-CM

## 2023-11-15 NOTE — Progress Notes (Signed)
 Cardiology Office Note   Date:  11/17/2023   ID:  Maria Rich, DOB Jul 02, 1951, MRN [id number]  PCP:  Ollen Bowl, MD  Cardiologist:   Dietrich Pates, MD   F/U of CV dz and HTN     History of Present Illness: Maria Rich is a 73 y.o. female with a history of mild CV dz,CAD (on CT scan for lung cancer screening),  HL, HTN .  Carotid USN in November 2021 what showed mild carotid plaquing    I saw the pt in March 2024  Since seen she denies CP  No palpitations     FOllowed by Dr Nickola Major   Current Outpatient Medications  Medication Sig Dispense Refill   Ascorbic Acid (VITAMIN C PO) Take 1 tablet by mouth daily with lunch.     aspirin 81 MG chewable tablet Chew 81 mg by mouth daily with lunch.     Cholecalciferol (VITAMIN D) 125 MCG (5000 UT) CAPS Take 5,000 Units by mouth daily.     Glucosamine HCl (GLUCOSAMINE PO) Take 1 tablet by mouth daily with lunch.     golimumab (SIMPONI ARIA) 50 MG/4ML SOLN injection See admin instructions.     lisinopril-hydrochlorothiazide (ZESTORETIC) 20-25 MG tablet Take 1 tablet by mouth daily. 90 tablet 3   Omega-3 Fatty Acids (FISH OIL PO) Take 1 capsule by mouth in the morning and at bedtime.     rosuvastatin (CRESTOR) 10 MG tablet Take 1 tablet (10 mg total) by mouth daily. 90 tablet 3   No current facility-administered medications for this visit.    Allergies:   Patient has no known allergies.   Past Medical History:  Diagnosis Date   Atypical mole 11/27/2001   left low back wider shave slight to moderate   Atypical mole 07/26/2005   mod-right mid back   Atypical mole 06/13/2008   mod-left upperarm (WS)   Atypical mole 03/30/2011   mild-right inner thigh   Atypical mole 07/17/2013   mod-left inner knee   Atypical mole 12/10/2014   mild-left scapula   Atypical mole 02/05/2016   mild-left upper paraspinal   Atypical nevi 09/23/2004   left trapezius- moderate   Atypical nevi 09/23/2004   mod-center upper back   Family history  of adverse reaction to anesthesia    mother had a topical anesthesia , skin irritation, when colonoscopy didn't use any anesthesia   High cholesterol    Hypertension    Rheumatoid arthritis (HCC)    2022 summer    Past Surgical History:  Procedure Laterality Date   EYE SURGERY Bilateral    cataract implant surgery,right eye   HIP SURGERY Left    2022   LUMBAR LAMINECTOMY/DECOMPRESSION MICRODISCECTOMY Left 05/09/2020   Procedure: Laminectomy for facet/synovial cyst - Lumbar Five-Sacral One - Left;  Surgeon: Tia Alert, MD;  Location: [facility] OR;  Service: Neurosurgery;  Laterality: Left;   TOTAL ABDOMINAL HYSTERECTOMY W/ BILATERAL SALPINGOOPHORECTOMY       Social History:  The patient  reports that she has been smoking cigarettes. She has a 20 pack-year smoking history. She has never used smokeless tobacco. She reports current alcohol use. She reports that she does not use drugs.   Family History:  The patient's family history includes Heart attack in her father; Heart disease in her brother and sister.    ROS:  Please see the history of present illness. All other systems are reviewed and  Negative to the above problem except as noted.  PHYSICAL EXAM: VS:  BP (!) 142/66   Pulse 63   Wt 123 lb 9.6 oz (56.1 kg)   SpO2 95%   BMI 23.35 kg/m   BP on my check 126/60 GEN:  Thin 73 yo  in no acute distress HEENT: normal Neck: no JVD, no carotid bruits Cardiac: RRR, S1, S2  No murmurs.  No LE edema  Respiratory:  clear to auscultation  GI: soft, nontender, no masses No hepatomegaly    EKG:  SB 54 bpm  Possible septal MI  Lipid Panel    Component Value Date/Time   CHOL 145 02/05/2021 0831   TRIG 163 (H) 02/05/2021 0831   HDL 59 02/05/2021 0831   CHOLHDL 2.5 02/05/2021 0831   CHOLHDL 2.4 08/28/2015 1447   VLDL 23 08/28/2015 1447   LDLCALC 59 02/05/2021 0831      Wt Readings from Last 3 Encounters:  11/17/23 123 lb 9.6 oz (56.1 kg)  06/24/23 124 lb 9.6 oz (56.5 kg)   11/15/22 128 lb 3.2 oz (58.2 kg)      ASSESSMENT AND PLAN:  1  HTN BP is OK on  follow up   Again, recomm she keep close follow up as outpt  Continue current meds   2  CAD Coronary calcifications on CT   Pt denies CP    3  CV dz  Plaquing remains mild      Last scan in 2021    4   HL   Continue statin  LDL 60  HDL 69 (Jan 2025   Keep on Crestor  5  Metabolics   A1C is 5.7    REviewed diet        F/U in clinic in 18 months   Signed, Dietrich Pates, MD  11/17/2023 8:23 AM    [facility] [address] [place], [place], [place]  [address] Phone: [phone]; Fax: [phone]

## 2023-11-16 DIAGNOSIS — M7062 Trochanteric bursitis, left hip: Secondary | ICD-10-CM | POA: Diagnosis not present

## 2023-11-16 DIAGNOSIS — M7061 Trochanteric bursitis, right hip: Secondary | ICD-10-CM | POA: Diagnosis not present

## 2023-11-16 DIAGNOSIS — M25551 Pain in right hip: Secondary | ICD-10-CM | POA: Diagnosis not present

## 2023-11-16 DIAGNOSIS — M545 Low back pain, unspecified: Secondary | ICD-10-CM | POA: Diagnosis not present

## 2023-11-17 ENCOUNTER — Ambulatory Visit: Payer: Medicare Other | Attending: Internal Medicine | Admitting: Internal Medicine

## 2023-11-17 ENCOUNTER — Encounter: Payer: Self-pay | Admitting: Internal Medicine

## 2023-11-17 VITALS — BP 142/66 | HR 63 | Wt 123.6 lb

## 2023-11-17 DIAGNOSIS — I10 Essential (primary) hypertension: Secondary | ICD-10-CM | POA: Diagnosis not present

## 2023-11-17 NOTE — Patient Instructions (Addendum)
 Medication Instructions:  Your physician recommends that you continue on your current medications as directed. Please refer to the Current Medication list given to you today.  *If you need a refill on your cardiac medications before your next appointment, please call your pharmacy*  Lab Work: If you have labs (blood work) drawn today and your tests are completely normal, you will receive your results only by: MyChart Message (if you have MyChart) OR A paper copy in the mail If you have any lab test that is abnormal or we need to change your treatment, we will call you to review the results.   Testing/Procedures: None ordered today.  Follow-Up: At [facility], you and your health needs are our priority.  As part of our continuing mission to provide you with exceptional heart care, we have created designated Provider Care Teams.  These Care Teams include your primary Cardiologist (physician) and Advanced Practice Providers (APPs -  Physician Assistants and Nurse Practitioners) who all work together to provide you with the care you need, when you need it.  We recommend signing up for the patient portal called "MyChart".  Sign up information is provided on this After Visit Summary.  MyChart is used to connect with patients for Virtual Visits (Telemedicine).  Patients are able to view lab/test results, encounter notes, upcoming appointments, etc.  Non-urgent messages can be sent to your provider as well.   To learn more about what you can do with MyChart, go to [email or web address].    Your next appointment:   18 months  Provider:   Dietrich Pates, MD     Other Instructions       1st Floor: - Lobby - Registration  - Pharmacy  - Lab - Cafe  2nd Floor: - PV Lab - Diagnostic Testing (echo, CT, nuclear med)  3rd Floor: - Vacant  4th Floor: - TCTS (cardiothoracic surgery) - AFib Clinic - Structural Heart Clinic - Vascular Surgery  - Vascular Ultrasound  5th  Floor: - [facility] Cardiology (general and EP) - Clinical Pharmacy for coumadin, hypertension, lipid, weight-loss medications, and med management appointments    Valet parking services will be available as well.

## 2023-11-22 DIAGNOSIS — L918 Other hypertrophic disorders of the skin: Secondary | ICD-10-CM | POA: Diagnosis not present

## 2023-11-22 DIAGNOSIS — D2239 Melanocytic nevi of other parts of face: Secondary | ICD-10-CM | POA: Diagnosis not present

## 2023-11-22 DIAGNOSIS — D2271 Melanocytic nevi of right lower limb, including hip: Secondary | ICD-10-CM | POA: Diagnosis not present

## 2023-11-22 DIAGNOSIS — D2272 Melanocytic nevi of left lower limb, including hip: Secondary | ICD-10-CM | POA: Diagnosis not present

## 2023-11-22 DIAGNOSIS — D2262 Melanocytic nevi of left upper limb, including shoulder: Secondary | ICD-10-CM | POA: Diagnosis not present

## 2023-11-22 DIAGNOSIS — L570 Actinic keratosis: Secondary | ICD-10-CM | POA: Diagnosis not present

## 2023-11-22 DIAGNOSIS — L821 Other seborrheic keratosis: Secondary | ICD-10-CM | POA: Diagnosis not present

## 2023-12-09 DIAGNOSIS — M25552 Pain in left hip: Secondary | ICD-10-CM | POA: Diagnosis not present

## 2023-12-09 DIAGNOSIS — M1611 Unilateral primary osteoarthritis, right hip: Secondary | ICD-10-CM | POA: Diagnosis not present

## 2023-12-13 ENCOUNTER — Telehealth: Payer: Self-pay | Admitting: Internal Medicine

## 2023-12-13 NOTE — Telephone Encounter (Signed)
   Pre-operative Risk Assessment    Patient Name: Maria Rich  DOB: 12-29-1950 MRN: [id number]   Date of last office visit: 11/17/2023  Date of next office visit: none   Request for Surgical Clearance    Procedure:   right total hip arthroplasty  Date of Surgery:  Clearance 02/22/24                                Surgeon:  Dr. Ollen Gross Surgeon's Group or Practice Name:  [facility] Phone number:  [phone] Fax number:  [phone]  [facility]   Type of Clearance Requested:   - Medical  - Pharmacy:  Hold Aspirin need instruction   Type of Anesthesia:   choice   Additional requests/questions:    SignedRoyann Shivers   12/13/2023, 8:54 AM

## 2023-12-14 NOTE — Telephone Encounter (Signed)
 I saw the pt in early March 2025  Doing well at the time    From cardiac standpoint I think she is a relatively low risk for  Surgery   OK to proceed

## 2023-12-15 DIAGNOSIS — M0609 Rheumatoid arthritis without rheumatoid factor, multiple sites: Secondary | ICD-10-CM | POA: Diagnosis not present

## 2023-12-15 NOTE — Telephone Encounter (Signed)
   Name: Maria Rich  DOB: Sep 01, 1951  MRN: [id number]   Primary Cardiologist: Dietrich Pates, MD  Chart reviewed as part of pre-operative protocol coverage. Maria Rich was last seen on 11/17/2023 by Dr. Tenny Craw.  Patient was doing well at that time. Per Dr. Tenny Craw "I saw the pt in early March 2025  Doing well at the time    From cardiac standpoint I think she is a relatively low risk for  Surgery   OK to proceed."  Therefore, based on ACC/AHA guidelines, the patient would be an acceptable risk for the planned procedure without further cardiovascular testing.   Ideally aspirin should be continued without interruption, however if the bleeding risk is too great, aspirin may be held for 5-7 days prior to surgery. Please resume aspirin post operatively when it is felt to be safe from a bleeding standpoint.    I will route this recommendation to the requesting party via Epic fax function and remove from pre-op pool. Please call with questions.  Carlos Levering, NP 12/15/2023, 7:39 AM

## 2024-01-12 DIAGNOSIS — Z01818 Encounter for other preprocedural examination: Secondary | ICD-10-CM | POA: Diagnosis not present

## 2024-01-12 DIAGNOSIS — J449 Chronic obstructive pulmonary disease, unspecified: Secondary | ICD-10-CM | POA: Diagnosis not present

## 2024-01-12 DIAGNOSIS — I700 Atherosclerosis of aorta: Secondary | ICD-10-CM | POA: Diagnosis not present

## 2024-01-12 DIAGNOSIS — I2510 Atherosclerotic heart disease of native coronary artery without angina pectoris: Secondary | ICD-10-CM | POA: Diagnosis not present

## 2024-01-12 DIAGNOSIS — F17200 Nicotine dependence, unspecified, uncomplicated: Secondary | ICD-10-CM | POA: Diagnosis not present

## 2024-01-13 ENCOUNTER — Other Ambulatory Visit: Payer: Self-pay | Admitting: Internal Medicine

## 2024-01-31 DIAGNOSIS — M0609 Rheumatoid arthritis without rheumatoid factor, multiple sites: Secondary | ICD-10-CM | POA: Diagnosis not present

## 2024-01-31 DIAGNOSIS — Z6822 Body mass index (BMI) 22.0-22.9, adult: Secondary | ICD-10-CM | POA: Diagnosis not present

## 2024-01-31 DIAGNOSIS — M1991 Primary osteoarthritis, unspecified site: Secondary | ICD-10-CM | POA: Diagnosis not present

## 2024-01-31 DIAGNOSIS — Z79899 Other long term (current) drug therapy: Secondary | ICD-10-CM | POA: Diagnosis not present

## 2024-02-22 DIAGNOSIS — M25751 Osteophyte, right hip: Secondary | ICD-10-CM | POA: Diagnosis not present

## 2024-02-22 DIAGNOSIS — M1611 Unilateral primary osteoarthritis, right hip: Secondary | ICD-10-CM | POA: Diagnosis not present

## 2024-03-02 DIAGNOSIS — M0609 Rheumatoid arthritis without rheumatoid factor, multiple sites: Secondary | ICD-10-CM | POA: Diagnosis not present

## 2024-03-29 DIAGNOSIS — Z5189 Encounter for other specified aftercare: Secondary | ICD-10-CM | POA: Diagnosis not present

## 2024-03-29 DIAGNOSIS — Z96642 Presence of left artificial hip joint: Secondary | ICD-10-CM | POA: Diagnosis not present

## 2024-03-29 DIAGNOSIS — M7062 Trochanteric bursitis, left hip: Secondary | ICD-10-CM | POA: Diagnosis not present

## 2024-04-11 ENCOUNTER — Encounter: Payer: Self-pay | Admitting: Internal Medicine

## 2024-04-12 ENCOUNTER — Other Ambulatory Visit: Payer: Self-pay

## 2024-04-12 ENCOUNTER — Other Ambulatory Visit: Payer: Self-pay | Admitting: Internal Medicine

## 2024-04-12 MED ORDER — LISINOPRIL 20 MG PO TABS: 20.0000 mg | ORAL_TABLET | Freq: Every day | ORAL | 2 refills | Status: DC

## 2024-04-12 MED ORDER — HYDROCHLOROTHIAZIDE 12.5 MG PO CAPS: 12.5000 mg | ORAL_CAPSULE | Freq: Every day | ORAL | 2 refills | Status: DC

## 2024-04-12 NOTE — Progress Notes (Signed)
 Sent medication to preferred pharmacy. Okay to separate and decrease medications per Dr. Okey. .  Lisinopril  20 mg once daily. Hydrochlorothiazide  12.5 mg once daily.

## 2024-04-12 NOTE — Telephone Encounter (Signed)
 Confirm doseing   hydrochlorothiazide  does not come as 10 mg   ? 12.5 mg   OK to separate   Need correct dose

## 2024-04-13 ENCOUNTER — Other Ambulatory Visit: Payer: Self-pay | Admitting: *Deleted

## 2024-04-13 MED ORDER — LISINOPRIL-HYDROCHLOROTHIAZIDE 20-12.5 MG PO TABS: 1.0000 | ORAL_TABLET | Freq: Every day | ORAL | 3 refills | Status: AC

## 2024-04-24 DIAGNOSIS — R252 Cramp and spasm: Secondary | ICD-10-CM | POA: Diagnosis not present

## 2024-04-27 DIAGNOSIS — M0609 Rheumatoid arthritis without rheumatoid factor, multiple sites: Secondary | ICD-10-CM | POA: Diagnosis not present

## 2024-04-27 DIAGNOSIS — Z111 Encounter for screening for respiratory tuberculosis: Secondary | ICD-10-CM | POA: Diagnosis not present

## 2024-04-27 DIAGNOSIS — Z79899 Other long term (current) drug therapy: Secondary | ICD-10-CM | POA: Diagnosis not present

## 2024-04-27 DIAGNOSIS — R5383 Other fatigue: Secondary | ICD-10-CM | POA: Diagnosis not present

## 2024-05-20 ENCOUNTER — Encounter (HOSPITAL_BASED_OUTPATIENT_CLINIC_OR_DEPARTMENT_OTHER): Payer: Self-pay | Admitting: Emergency Medicine

## 2024-05-20 ENCOUNTER — Ambulatory Visit (HOSPITAL_BASED_OUTPATIENT_CLINIC_OR_DEPARTMENT_OTHER)
Admission: EM | Admit: 2024-05-20 | Discharge: 2024-05-20 | Disposition: A | Discharge status: Home / Self Care | Attending: Family Medicine | Admitting: Family Medicine

## 2024-05-20 DIAGNOSIS — J3489 Other specified disorders of nose and nasal sinuses: Secondary | ICD-10-CM

## 2024-05-20 DIAGNOSIS — H938X3 Other specified disorders of ear, bilateral: Secondary | ICD-10-CM | POA: Diagnosis not present

## 2024-05-20 MED ORDER — PREDNISONE 20 MG PO TABS: 20.0000 mg | ORAL_TABLET | Freq: Every day | ORAL | 0 refills | Status: AC

## 2024-05-20 NOTE — ED Triage Notes (Signed)
 Pt reports she just traveled back from [place]  she noticed on Thursday she couldn't hear much out of her right ear.

## 2024-05-20 NOTE — ED Provider Notes (Signed)
 [facility]    CSN: [id number] Arrival date & time: 05/20/24  9075      History   Chief Complaint No chief complaint on file.   HPI Maria Rich is a 73 y.o. female.   73 year old female with report of ear pain and pressure that started on Thursday, 05/17/2024.  She traveled home from visiting in [place]  for several weeks and she noticed on Thursday that she was having ear pressure and even difficulty hearing.  She feels like she is underwater or in an airplane or in a tunnel and is having a muffled sound from her ears.  She has some mild sinus pressure but no runny nose, no cough, no fever, no body aches.     Past Medical History:  Diagnosis Date   Atypical mole 11/27/2001   left low back wider shave slight to moderate   Atypical mole 07/26/2005   mod-right mid back   Atypical mole 06/13/2008   mod-left upperarm ([facility])   Atypical mole 03/30/2011   mild-right inner thigh   Atypical mole 07/17/2013   mod-left inner knee   Atypical mole 12/10/2014   mild-left scapula   Atypical mole 02/05/2016   mild-left upper paraspinal   Atypical nevi 09/23/2004   left trapezius- moderate   Atypical nevi 09/23/2004   mod-center upper back   Family history of adverse reaction to anesthesia    mother had a topical anesthesia , skin irritation, when colonoscopy didn't use any anesthesia   High cholesterol    Hypertension    Rheumatoid arthritis (HCC)    2022 summer    Patient Active Problem List   Diagnosis Date Noted   Paronychia 06/26/2013   Pseudophakia 08/25/2012   After-cataract 08/25/2011   Hypertensive disorder 07/11/2011   Tobacco abuse 07/11/2011   PURE HYPERCHOLESTEROLEMIA 06/10/2010   CAROTID ARTERY DISEASE 05/08/2009    Past Surgical History:  Procedure Laterality Date   EYE SURGERY Bilateral    cataract implant surgery,right eye   HIP SURGERY Left    2022   LUMBAR LAMINECTOMY/DECOMPRESSION MICRODISCECTOMY Left 05/09/2020   Procedure:  Laminectomy for facet/synovial cyst - Lumbar Five-Sacral One - Left;  Surgeon: Joshua Alm RAMAN, MD;  Location: [facility] OR;  Service: Neurosurgery;  Laterality: Left;   TOTAL ABDOMINAL HYSTERECTOMY W/ BILATERAL SALPINGOOPHORECTOMY      OB History   No obstetric history on file.      Home Medications    Prior to Admission medications   Medication Sig Start Date End Date Taking? Authorizing Provider  Cholecalciferol (VITAMIN D ) 125 MCG (5000 UT) CAPS Take 5,000 Units by mouth daily. 05/02/23  Yes Ross, Paula V, MD  Glucosamine HCl (GLUCOSAMINE PO) Take 1 tablet by mouth daily with lunch.   Yes [provider]  lisinopril -hydrochlorothiazide  (ZESTORETIC ) 20-12.5 MG tablet Take 1 tablet by mouth daily. 04/13/24  Yes Okey Vina GAILS, MD  predniSONE  (DELTASONE ) 20 MG tablet Take 1 tablet (20 mg total) by mouth daily with breakfast for 5 days. 05/20/24 05/25/24 Yes Ival Domino, FNP  rosuvastatin  (CRESTOR ) 10 MG tablet TAKE 1 TABLET BY MOUTH EVERY DAY 01/13/24  Yes Ross, Paula V, MD  Ascorbic Acid (VITAMIN C PO) Take 1 tablet by mouth daily with lunch.    [provider]  aspirin 81 MG chewable tablet Chew 81 mg by mouth daily with lunch.    [provider]  golimumab  (SIMPONI  ARIA) 50 MG/4ML SOLN injection See admin instructions.    [provider]  Omega-3 Fatty  Acids (FISH OIL PO) Take 1 capsule by mouth in the morning and at bedtime.    [provider]    Family History Family History  Problem Relation Age of Onset   Heart attack Father    Heart disease Sister    Heart disease Brother     Social History Social History   Tobacco Use   Smoking status: Every Day    Current packs/day: 0.50    Average packs/day: 0.5 packs/day for 40.0 years (20.0 ttl pk-yrs)    Types: Cigarettes   Smokeless tobacco: Never   Tobacco comments:    trying to quit and has been 3 weeks  Vaping Use   Vaping status: Former  Substance Use Topics   Alcohol use: Yes     Comment: beer on weekends   Drug use: No     Allergies   Tramadol   Review of Systems Review of Systems  Constitutional:  Negative for chills and fever.  HENT:  Positive for sinus pressure. Negative for ear pain (No ear pain but ear fullness or pressure noted.) and sore throat.   Eyes:  Negative for pain and visual disturbance.  Respiratory:  Negative for cough and shortness of breath.   Cardiovascular:  Negative for chest pain and palpitations.  Gastrointestinal:  Negative for abdominal pain, constipation, diarrhea, nausea and vomiting.  Genitourinary:  Negative for dysuria and hematuria.  Musculoskeletal:  Negative for arthralgias and back pain.  Skin:  Negative for color change and rash.  Neurological:  Negative for seizures and syncope.  All other systems reviewed and are negative.    Physical Exam Triage Vital Signs ED Triage Vitals  Encounter Vitals Group     BP 05/20/24 1102 (!) 192/72     Girls Systolic BP Percentile --      Girls Diastolic BP Percentile --      Boys Systolic BP Percentile --      Boys Diastolic BP Percentile --      Pulse Rate 05/20/24 1102 (!) 57     Resp 05/20/24 1102 18     Temp 05/20/24 1102 98.2 F (36.8 C)     Temp Source 05/20/24 1102 Oral     SpO2 05/20/24 1102 98 %     Weight --      Height --      Head Circumference --      Peak Flow --      Pain Score 05/20/24 1100 0     Pain Loc --      Pain Education --      Exclude from Growth Chart --    No data found.  Updated Vital Signs BP (!) 182/65 (BP Location: Right Arm)   Pulse (!) 57   Temp 98.2 F (36.8 C) (Oral)   Resp 18   SpO2 98%   Visual Acuity Right Eye Distance:   Left Eye Distance:   Bilateral Distance:    Right Eye Near:   Left Eye Near:    Bilateral Near:     Physical Exam Vitals and nursing note reviewed.  Constitutional:      General: She is not in acute distress.    Appearance: She is well-developed. She is not ill-appearing or toxic-appearing.   HENT:     Head: Normocephalic and atraumatic.     Right Ear: Hearing, tympanic membrane, ear canal and external ear normal.     Left Ear: Hearing, tympanic membrane, ear canal and external ear normal.  Nose: No congestion or rhinorrhea.     Right Sinus: No maxillary sinus tenderness or frontal sinus tenderness.     Left Sinus: No maxillary sinus tenderness or frontal sinus tenderness.     Mouth/Throat:     Lips: Pink.     Mouth: Mucous membranes are moist.     Pharynx: Uvula midline. No oropharyngeal exudate or posterior oropharyngeal erythema.     Tonsils: No tonsillar exudate.  Eyes:     Conjunctiva/sclera: Conjunctivae normal.     Pupils: Pupils are equal, round, and reactive to light.  Cardiovascular:     Rate and Rhythm: Normal rate and regular rhythm.     Heart sounds: S1 normal and S2 normal. No murmur heard. Pulmonary:     Effort: Pulmonary effort is normal. No respiratory distress.     Breath sounds: Normal breath sounds. No decreased breath sounds, wheezing, rhonchi or rales.  Abdominal:     General: Bowel sounds are normal.     Palpations: Abdomen is soft.     Tenderness: There is no abdominal tenderness.  Musculoskeletal:        General: No swelling.     Cervical back: Neck supple.  Lymphadenopathy:     Head:     Right side of head: No submental, submandibular, tonsillar, preauricular or posterior auricular adenopathy.     Left side of head: No submental, submandibular, tonsillar, preauricular or posterior auricular adenopathy.     Cervical: Cervical adenopathy present.     Right cervical: Superficial cervical adenopathy present.     Left cervical: Superficial cervical adenopathy present.  Skin:    General: Skin is warm and dry.     Capillary Refill: Capillary refill takes less than 2 seconds.     Findings: No rash.  Neurological:     Mental Status: She is alert and oriented to person, place, and time.  Psychiatric:        Mood and Affect: Mood normal.       UC Treatments / Results  Labs (all labs ordered are listed, but only abnormal results are displayed) Labs Reviewed - No data to display  EKG   Radiology No results found.  Procedures Procedures (including critical care time)  Medications Ordered in UC Medications - No data to display  Initial Impression / Assessment and Plan / UC Course  I have reviewed the triage vital signs and the nursing notes.  Pertinent labs & imaging results that were available during my care of the patient were reviewed by me and considered in my medical decision making (see chart for details).  Plan of Care: Exam is essentially normal but the patient has a few enlarged lymph nodes in her neck.  She is having significant ear pressure or fullness and some sinus pressure.  Will try prednisone  20 mg daily for 5 days.  Get plenty of fluids and rest.  Follow-up if symptoms do not improve, worsen or new symptoms occur.  Reviewed possible side effects of using prednisone .  I reviewed the plan of care with the patient and/or the patient's guardian.  The patient and/or guardian had time to ask questions and acknowledged that the questions were answered.  I provided instruction on symptoms or reasons to return here or to go to an ER, if symptoms/condition did not improve, worsened or if new symptoms occurred.  Final Clinical Impressions(s) / UC Diagnoses   Final diagnoses:  Pressure sensation in both ears  Sinus pressure     Discharge Instructions  Ear and sinus pressure/congestion: Prednisone  20 mg daily for 5 days.  Take it as early in the day as possible.  Get plenty of fluids and rest.    Follow-up if symptoms do not resolve, if symptoms worsen or if new symptoms occur.     ED Prescriptions     Medication Sig Dispense Auth. Provider   predniSONE  (DELTASONE ) 20 MG tablet Take 1 tablet (20 mg total) by mouth daily with breakfast for 5 days. 5 tablet Gurkirat Basher, FNP      PDMP not  reviewed this encounter.   Ival Domino, FNP 05/20/24 1146

## 2024-05-20 NOTE — Discharge Instructions (Signed)
 Ear and sinus pressure/congestion: Prednisone  20 mg daily for 5 days.  Take it as early in the day as possible.  Get plenty of fluids and rest.    Follow-up if symptoms do not resolve, if symptoms worsen or if new symptoms occur.

## 2024-05-21 ENCOUNTER — Encounter: Payer: Self-pay | Admitting: Internal Medicine

## 2024-05-25 DIAGNOSIS — Z79899 Other long term (current) drug therapy: Secondary | ICD-10-CM | POA: Diagnosis not present

## 2024-05-25 DIAGNOSIS — H52223 Regular astigmatism, bilateral: Secondary | ICD-10-CM | POA: Diagnosis not present

## 2024-05-25 DIAGNOSIS — H524 Presbyopia: Secondary | ICD-10-CM | POA: Diagnosis not present

## 2024-05-25 DIAGNOSIS — H6991 Unspecified Eustachian tube disorder, right ear: Secondary | ICD-10-CM | POA: Diagnosis not present

## 2024-05-25 DIAGNOSIS — K7689 Other specified diseases of liver: Secondary | ICD-10-CM | POA: Diagnosis not present

## 2024-05-25 DIAGNOSIS — Z961 Presence of intraocular lens: Secondary | ICD-10-CM | POA: Diagnosis not present

## 2024-05-25 DIAGNOSIS — I119 Hypertensive heart disease without heart failure: Secondary | ICD-10-CM | POA: Diagnosis not present

## 2024-05-25 DIAGNOSIS — H6593 Unspecified nonsuppurative otitis media, bilateral: Secondary | ICD-10-CM | POA: Diagnosis not present

## 2024-05-25 DIAGNOSIS — H9313 Tinnitus, bilateral: Secondary | ICD-10-CM | POA: Diagnosis not present

## 2024-05-25 DIAGNOSIS — H40053 Ocular hypertension, bilateral: Secondary | ICD-10-CM | POA: Diagnosis not present

## 2024-05-25 DIAGNOSIS — H90A31 Mixed conductive and sensorineural hearing loss, unilateral, right ear with restricted hearing on the contralateral side: Secondary | ICD-10-CM | POA: Diagnosis not present

## 2024-05-25 DIAGNOSIS — H90A22 Sensorineural hearing loss, unilateral, left ear, with restricted hearing on the contralateral side: Secondary | ICD-10-CM | POA: Diagnosis not present

## 2024-05-25 DIAGNOSIS — H6501 Acute serous otitis media, right ear: Secondary | ICD-10-CM | POA: Diagnosis not present

## 2024-06-01 ENCOUNTER — Other Ambulatory Visit: Payer: Self-pay | Admitting: Internal Medicine

## 2024-06-01 DIAGNOSIS — F17200 Nicotine dependence, unspecified, uncomplicated: Secondary | ICD-10-CM | POA: Diagnosis not present

## 2024-06-01 DIAGNOSIS — I739 Peripheral vascular disease, unspecified: Secondary | ICD-10-CM | POA: Diagnosis not present

## 2024-06-01 DIAGNOSIS — N179 Acute kidney failure, unspecified: Secondary | ICD-10-CM | POA: Diagnosis not present

## 2024-06-01 DIAGNOSIS — I119 Hypertensive heart disease without heart failure: Secondary | ICD-10-CM | POA: Diagnosis not present

## 2024-06-01 DIAGNOSIS — H6993 Unspecified Eustachian tube disorder, bilateral: Secondary | ICD-10-CM | POA: Diagnosis not present

## 2024-06-01 DIAGNOSIS — F419 Anxiety disorder, unspecified: Secondary | ICD-10-CM | POA: Diagnosis not present

## 2024-06-01 DIAGNOSIS — Z23 Encounter for immunization: Secondary | ICD-10-CM | POA: Diagnosis not present

## 2024-06-01 DIAGNOSIS — R200 Anesthesia of skin: Secondary | ICD-10-CM | POA: Diagnosis not present

## 2024-06-08 ENCOUNTER — Other Ambulatory Visit

## 2024-06-12 ENCOUNTER — Ambulatory Visit
Admission: RE | Admit: 2024-06-12 | Discharge: 2024-06-12 | Disposition: A | Discharge status: Home / Self Care | Source: Ambulatory Visit | Attending: Internal Medicine | Admitting: Internal Medicine

## 2024-06-12 DIAGNOSIS — I739 Peripheral vascular disease, unspecified: Secondary | ICD-10-CM

## 2024-06-12 DIAGNOSIS — I70213 Atherosclerosis of native arteries of extremities with intermittent claudication, bilateral legs: Secondary | ICD-10-CM | POA: Diagnosis not present

## 2024-06-15 DIAGNOSIS — I10 Essential (primary) hypertension: Secondary | ICD-10-CM | POA: Diagnosis not present

## 2024-06-21 DIAGNOSIS — Z820 Family history of epilepsy and other diseases of the nervous system: Secondary | ICD-10-CM | POA: Diagnosis not present

## 2024-06-21 DIAGNOSIS — I119 Hypertensive heart disease without heart failure: Secondary | ICD-10-CM | POA: Diagnosis not present

## 2024-06-21 DIAGNOSIS — I10 Essential (primary) hypertension: Secondary | ICD-10-CM | POA: Diagnosis not present

## 2024-06-21 DIAGNOSIS — R4189 Other symptoms and signs involving cognitive functions and awareness: Secondary | ICD-10-CM | POA: Diagnosis not present

## 2024-06-21 DIAGNOSIS — I739 Peripheral vascular disease, unspecified: Secondary | ICD-10-CM | POA: Diagnosis not present

## 2024-06-22 DIAGNOSIS — Z111 Encounter for screening for respiratory tuberculosis: Secondary | ICD-10-CM | POA: Diagnosis not present

## 2024-06-22 DIAGNOSIS — R5383 Other fatigue: Secondary | ICD-10-CM | POA: Diagnosis not present

## 2024-06-22 DIAGNOSIS — Z79899 Other long term (current) drug therapy: Secondary | ICD-10-CM | POA: Diagnosis not present

## 2024-06-22 DIAGNOSIS — M0609 Rheumatoid arthritis without rheumatoid factor, multiple sites: Secondary | ICD-10-CM | POA: Diagnosis not present

## 2024-06-27 ENCOUNTER — Ambulatory Visit: Attending: Vascular Surgery | Admitting: Vascular Surgery

## 2024-06-27 ENCOUNTER — Encounter: Payer: Self-pay | Admitting: Vascular Surgery

## 2024-06-27 VITALS — BP 131/75 | HR 70 | Temp 98.2°F | Ht 61.0 in | Wt 122.0 lb

## 2024-06-27 DIAGNOSIS — I70212 Atherosclerosis of native arteries of extremities with intermittent claudication, left leg: Secondary | ICD-10-CM | POA: Diagnosis not present

## 2024-06-27 NOTE — Progress Notes (Signed)
 Patient ID: Maria Rich, female   DOB: 11-Sep-1951, 73 y.o.   MRN: [id number]  Reason for Consult: New Patient (Initial Visit)   Referred by Vernon Velna SAUNDERS, MD  Subjective:     HPI:  Maria Rich is a 73 y.o. female history of left foot pain.  She relates this to varicosities and spider veins of the left foot.  She states that they are more prevalent on the left rather than the right.  She does not walk barefoot.  She denies frank claudication.  She does not really have any rest pain no tissue loss no ulceration.  She is a longtime smoker currently down to about 5 cigarettes/day.  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  Walks without limitation.  Other than occasional left foot pain with discoloration she has no other complaints.  No stroke TIA or amaurosis.  Past Medical History:  Diagnosis Date   Atypical mole 11/27/2001   left low back wider shave slight to moderate   Atypical mole 07/26/2005   mod-right mid back   Atypical mole 06/13/2008   mod-left upperarm ([facility])   Atypical mole 03/30/2011   mild-right inner thigh   Atypical mole 07/17/2013   mod-left inner knee   Atypical mole 12/10/2014   mild-left scapula   Atypical mole 02/05/2016   mild-left upper paraspinal   Atypical nevi 09/23/2004   left trapezius- moderate   Atypical nevi 09/23/2004   mod-center upper back   Family history of adverse reaction to anesthesia    mother had a topical anesthesia , skin irritation, when colonoscopy didn't use any anesthesia   High cholesterol    Hypertension    Rheumatoid arthritis (HCC)    2022 summer   Family History  Problem Relation Age of Onset   Heart attack Father    Heart disease Sister    Heart disease Brother    Past Surgical History:  Procedure Laterality Date   EYE SURGERY Bilateral    cataract implant surgery,right eye   HIP SURGERY Left    2022   LUMBAR LAMINECTOMY/DECOMPRESSION MICRODISCECTOMY  Left 05/09/2020   Procedure: Laminectomy for facet/synovial cyst - Lumbar Five-Sacral One - Left;  Surgeon: Joshua Alm RAMAN, MD;  Location: [facility] OR;  Service: Neurosurgery;  Laterality: Left;   TOTAL ABDOMINAL HYSTERECTOMY W/ BILATERAL SALPINGOOPHORECTOMY      Short Social History:  Social History   Tobacco Use   Smoking status: Every Day    Current packs/day: 0.50    Average packs/day: 0.5 packs/day for 40.0 years (20.0 ttl pk-yrs)    Types: Cigarettes   Smokeless tobacco: Never   Tobacco comments:    trying to quit and has been 3 weeks  Substance Use Topics   Alcohol use: Yes    Comment: beer on weekends    Allergies  Allergen Reactions   Tramadol Other (See Comments)    Current Outpatient Medications  Medication Sig Dispense Refill   Ascorbic Acid (VITAMIN C PO) Take 1 tablet by mouth daily with lunch.     aspirin 81 MG chewable tablet Chew 81 mg by mouth daily with lunch.     Cholecalciferol (VITAMIN D ) 125 MCG (5000 UT) CAPS Take 5,000 Units by mouth daily.     Glucosamine HCl (GLUCOSAMINE PO) Take 1 tablet by mouth daily with lunch.     golimumab  (SIMPONI  ARIA) 50 MG/4ML SOLN injection See admin instructions.     lisinopril -hydrochlorothiazide  (ZESTORETIC ) 20-12.5 MG tablet  Take 1 tablet by mouth daily. 90 tablet 3   Omega-3 Fatty Acids (FISH OIL PO) Take 1 capsule by mouth in the morning and at bedtime.     rosuvastatin  (CRESTOR ) 10 MG tablet TAKE 1 TABLET BY MOUTH EVERY DAY 90 tablet 3   No current facility-administered medications for this visit.    Review of Systems  Constitutional:  Constitutional negative. HENT: HENT negative.  Eyes: Eyes negative.  Respiratory: Respiratory negative.  Cardiovascular: Cardiovascular negative.  GI: Gastrointestinal negative.  Musculoskeletal: Positive for leg pain.  Skin: Skin negative.  Neurological: Neurological negative. Hematologic: Hematologic/lymphatic negative.  Psychiatric: Psychiatric negative.        Objective:   Objective   Vitals:   06/27/24 1333  BP: 131/75  Pulse: 70  Temp: 98.2 F (36.8 C)  SpO2: 97%  Weight: 122 lb (55.3 kg)  Height: 5' 1 (1.549 m)   Body mass index is 23.05 kg/m.  Physical Exam HENT:     Head: Normocephalic.     Nose: Nose normal.     Mouth/Throat:     Mouth: Mucous membranes are moist.  Cardiovascular:     Rate and Rhythm: Normal rate.     Pulses:          Femoral pulses are 2+ on the right side and 2+ on the left side.      Popliteal pulses are 2+ on the right side and 0 on the left side.       Dorsalis pedis pulses are 2+ on the right side and 0 on the left side.       Posterior tibial pulses are 0 on the left side.  Pulmonary:     Effort: Pulmonary effort is normal.  Abdominal:     General: Abdomen is flat.  Musculoskeletal:        General: Normal range of motion.     Right lower leg: No edema.     Left lower leg: No edema.  Skin:    General: Skin is warm.     Capillary Refill: Capillary refill takes 2 to 3 seconds.  Neurological:     General: No focal deficit present.     Mental Status: She is alert. Mental status is at baseline.     Data: BILATERAL LOWER EXTREMITY ARTERIAL DUPLEX SCAN   TECHNIQUE: Gray-scale sonography as well as color Doppler and duplex ultrasound was performed to evaluate the arteries of both lower extremities including the common, superficial and profunda femoral arteries, popliteal artery and calf arteries.   COMPARISON:  None available   FINDINGS: Right Lower Extremity   ABI: 0.97   Inflow: Calcified atheromatous plaque seen scattered throughout the RIGHT common femoral artery. RIGHT common femoral artery is patent with multiphasic waveform.   Outflow: RIGHT profunda femoris artery is patent with multiphasic waveform. Calcified atheromatous plaque is seen in the distal RIGHT superficial femoral artery. RIGHT femoral artery is patent with multiphasic waveforms throughout. RIGHT popliteal artery is  patent with multiphasic waveform. No elevation of velocity to indicate significant stenosis.   Runoff: Normal posterior and anterior tibial arterial waveforms and velocities. Vessels are patent to the ankle.   Left Lower Extremity   ABI: 0.73   Inflow: Normal common femoral arterial waveforms and velocities. No evidence of inflow (aortoiliac) disease.   Outflow: Profunda femoris artery is patent with monophasic waveform. Calcified atheromatous plaque seen scattered throughout the superficial femoral artery with monophasic flow in the proximal and distal segments. No flow identified in the mid  LEFT femoral artery. Calcified and noncalcified atheromatous plaque seen throughout the LEFT SFA. LEFT popliteal artery is patent with monophasic waveform.   Runoff: LEFT anterior tibial artery is patent with multiphasic waveform. Posterior tibial artery is patent with monophasic waveform. No elevation of peak systolic velocity.   IMPRESSION: 1. No flow identified in the mid LEFT superficial femoral artery, suspicious for occlusion. Monophasic flow reconstitutes within the distal LEFT SFA with monophasic flow seen within the popliteal and posterior tibial arteries. Further evaluation with CT angiography of the lower extremities would be beneficial. 2. No significant stenosis of the RIGHT lower extremity arteries.       Assessment/Plan:     73 year old female with history of left foot pain does not appear to  be vascular in nature but she does have an ABI consistent with SFA occlusion without palpable popliteal pulse on the left with palpable pulses throughout the right lower extremity.  We discussed the need for total smoking cessation, continued aspirin and statin and follow-up in 1 year with left lower extremity arterial duplex and ABIs.  We discussed the need to protect her feet and continued walking and she demonstrates good understanding.     Penne Lonni Colorado MD [facility]

## 2024-06-28 ENCOUNTER — Encounter: Payer: Self-pay | Admitting: Vascular Surgery

## 2024-07-24 DIAGNOSIS — Z6823 Body mass index (BMI) 23.0-23.9, adult: Secondary | ICD-10-CM | POA: Diagnosis not present

## 2024-07-24 DIAGNOSIS — Z79899 Other long term (current) drug therapy: Secondary | ICD-10-CM | POA: Diagnosis not present

## 2024-07-24 DIAGNOSIS — M0609 Rheumatoid arthritis without rheumatoid factor, multiple sites: Secondary | ICD-10-CM | POA: Diagnosis not present

## 2024-07-24 DIAGNOSIS — M1991 Primary osteoarthritis, unspecified site: Secondary | ICD-10-CM | POA: Diagnosis not present

## 2024-07-31 DIAGNOSIS — H6991 Unspecified Eustachian tube disorder, right ear: Secondary | ICD-10-CM | POA: Diagnosis not present

## 2024-07-31 DIAGNOSIS — H903 Sensorineural hearing loss, bilateral: Secondary | ICD-10-CM | POA: Diagnosis not present

## 2024-08-21 DIAGNOSIS — M0609 Rheumatoid arthritis without rheumatoid factor, multiple sites: Secondary | ICD-10-CM | POA: Diagnosis not present

## 2024-10-11 ENCOUNTER — Other Ambulatory Visit

## 2024-10-18 ENCOUNTER — Other Ambulatory Visit

## 2024-10-18 ENCOUNTER — Inpatient Hospital Stay
Admission: RE | Admit: 2024-10-18 | Discharge: 2024-10-18 | Disposition: A | Source: Ambulatory Visit | Attending: Acute Care | Admitting: Acute Care

## 2024-10-18 DIAGNOSIS — Z87891 Personal history of nicotine dependence: Secondary | ICD-10-CM

## 2024-10-18 DIAGNOSIS — Z122 Encounter for screening for malignant neoplasm of respiratory organs: Secondary | ICD-10-CM

## 2024-10-18 DIAGNOSIS — F17210 Nicotine dependence, cigarettes, uncomplicated: Secondary | ICD-10-CM
